# Patient Record
Sex: Female | Born: 1970
Health system: Southern US, Community
[De-identification: ages and names within clinical notes are randomized; demographics above are authoritative.]

## PROBLEM LIST (undated history)

## (undated) DIAGNOSIS — J45909 Unspecified asthma, uncomplicated: Secondary | ICD-10-CM

## (undated) DIAGNOSIS — M1990 Unspecified osteoarthritis, unspecified site: Secondary | ICD-10-CM

## (undated) DIAGNOSIS — Z6841 Body Mass Index (BMI) 40.0 and over, adult: Secondary | ICD-10-CM

## (undated) HISTORY — DX: Body Mass Index (BMI) 40.0 and over, adult: Z684

## (undated) HISTORY — DX: Unspecified osteoarthritis, unspecified site: M19.90

## (undated) HISTORY — DX: Morbid (severe) obesity due to excess calories: E66.01

## (undated) HISTORY — DX: Unspecified asthma, uncomplicated: J45.909

---

## 1996-03-16 HISTORY — PX: CHOLECYSTECTOMY: SHX55

## 2003-03-17 HISTORY — PX: TUBAL LIGATION: SHX77

## 2005-03-16 HISTORY — PX: RECTAL PROLAPSE REPAIR, RECTOPEXY: SHX2311

## 2011-05-19 DIAGNOSIS — R32 Unspecified urinary incontinence: Secondary | ICD-10-CM | POA: Diagnosis not present

## 2011-05-19 DIAGNOSIS — J45909 Unspecified asthma, uncomplicated: Secondary | ICD-10-CM | POA: Diagnosis not present

## 2011-06-05 DIAGNOSIS — M069 Rheumatoid arthritis, unspecified: Secondary | ICD-10-CM | POA: Diagnosis not present

## 2011-06-05 DIAGNOSIS — I10 Essential (primary) hypertension: Secondary | ICD-10-CM | POA: Diagnosis not present

## 2011-06-20 DIAGNOSIS — J45909 Unspecified asthma, uncomplicated: Secondary | ICD-10-CM | POA: Diagnosis not present

## 2011-06-30 DIAGNOSIS — J45909 Unspecified asthma, uncomplicated: Secondary | ICD-10-CM | POA: Diagnosis not present

## 2011-06-30 DIAGNOSIS — M940 Chondrocostal junction syndrome [Tietze]: Secondary | ICD-10-CM | POA: Diagnosis not present

## 2011-07-03 DIAGNOSIS — J45909 Unspecified asthma, uncomplicated: Secondary | ICD-10-CM | POA: Diagnosis not present

## 2011-08-21 DIAGNOSIS — M549 Dorsalgia, unspecified: Secondary | ICD-10-CM | POA: Diagnosis not present

## 2011-08-21 DIAGNOSIS — J45909 Unspecified asthma, uncomplicated: Secondary | ICD-10-CM | POA: Diagnosis not present

## 2011-09-03 DIAGNOSIS — E782 Mixed hyperlipidemia: Secondary | ICD-10-CM | POA: Diagnosis not present

## 2011-09-03 DIAGNOSIS — E039 Hypothyroidism, unspecified: Secondary | ICD-10-CM | POA: Diagnosis not present

## 2011-09-03 DIAGNOSIS — D649 Anemia, unspecified: Secondary | ICD-10-CM | POA: Diagnosis not present

## 2011-09-03 DIAGNOSIS — M129 Arthropathy, unspecified: Secondary | ICD-10-CM | POA: Diagnosis not present

## 2011-09-03 DIAGNOSIS — E559 Vitamin D deficiency, unspecified: Secondary | ICD-10-CM | POA: Diagnosis not present

## 2011-09-07 DIAGNOSIS — M898X9 Other specified disorders of bone, unspecified site: Secondary | ICD-10-CM | POA: Diagnosis not present

## 2011-09-07 DIAGNOSIS — M1710 Unilateral primary osteoarthritis, unspecified knee: Secondary | ICD-10-CM | POA: Diagnosis not present

## 2011-09-07 DIAGNOSIS — M1990 Unspecified osteoarthritis, unspecified site: Secondary | ICD-10-CM | POA: Diagnosis not present

## 2011-09-07 DIAGNOSIS — M19049 Primary osteoarthritis, unspecified hand: Secondary | ICD-10-CM | POA: Diagnosis not present

## 2011-09-07 DIAGNOSIS — M5137 Other intervertebral disc degeneration, lumbosacral region: Secondary | ICD-10-CM | POA: Diagnosis not present

## 2011-09-07 DIAGNOSIS — E559 Vitamin D deficiency, unspecified: Secondary | ICD-10-CM | POA: Diagnosis not present

## 2011-12-08 DIAGNOSIS — Z23 Encounter for immunization: Secondary | ICD-10-CM | POA: Diagnosis not present

## 2011-12-08 DIAGNOSIS — I10 Essential (primary) hypertension: Secondary | ICD-10-CM | POA: Diagnosis not present

## 2011-12-08 DIAGNOSIS — D649 Anemia, unspecified: Secondary | ICD-10-CM | POA: Diagnosis not present

## 2011-12-08 DIAGNOSIS — E782 Mixed hyperlipidemia: Secondary | ICD-10-CM | POA: Diagnosis not present

## 2011-12-08 DIAGNOSIS — E559 Vitamin D deficiency, unspecified: Secondary | ICD-10-CM | POA: Diagnosis not present

## 2011-12-08 DIAGNOSIS — E039 Hypothyroidism, unspecified: Secondary | ICD-10-CM | POA: Diagnosis not present

## 2011-12-08 DIAGNOSIS — M1990 Unspecified osteoarthritis, unspecified site: Secondary | ICD-10-CM | POA: Diagnosis not present

## 2012-01-15 DIAGNOSIS — I10 Essential (primary) hypertension: Secondary | ICD-10-CM | POA: Diagnosis not present

## 2012-01-15 DIAGNOSIS — J45909 Unspecified asthma, uncomplicated: Secondary | ICD-10-CM | POA: Diagnosis not present

## 2012-01-20 DIAGNOSIS — J45909 Unspecified asthma, uncomplicated: Secondary | ICD-10-CM | POA: Diagnosis not present

## 2012-01-20 DIAGNOSIS — I10 Essential (primary) hypertension: Secondary | ICD-10-CM | POA: Diagnosis not present

## 2012-02-03 DIAGNOSIS — I10 Essential (primary) hypertension: Secondary | ICD-10-CM | POA: Diagnosis not present

## 2012-02-03 DIAGNOSIS — J45909 Unspecified asthma, uncomplicated: Secondary | ICD-10-CM | POA: Diagnosis not present

## 2012-07-28 ENCOUNTER — Encounter: Payer: Self-pay | Admitting: Internal Medicine

## 2012-07-28 ENCOUNTER — Ambulatory Visit (INDEPENDENT_AMBULATORY_CARE_PROVIDER_SITE_OTHER): Payer: Medicare Other | Admitting: Internal Medicine

## 2012-07-28 VITALS — BP 135/89 | HR 50 | Temp 96.7°F | Ht 65.5 in | Wt 307.8 lb

## 2012-07-28 DIAGNOSIS — M129 Arthropathy, unspecified: Secondary | ICD-10-CM | POA: Diagnosis not present

## 2012-07-28 DIAGNOSIS — J45909 Unspecified asthma, uncomplicated: Secondary | ICD-10-CM

## 2012-07-28 DIAGNOSIS — Z0000 Encounter for general adult medical examination without abnormal findings: Secondary | ICD-10-CM | POA: Diagnosis not present

## 2012-07-28 DIAGNOSIS — M1990 Unspecified osteoarthritis, unspecified site: Secondary | ICD-10-CM

## 2012-07-28 DIAGNOSIS — N9089 Other specified noninflammatory disorders of vulva and perineum: Secondary | ICD-10-CM

## 2012-07-28 DIAGNOSIS — N907 Vulvar cyst: Secondary | ICD-10-CM

## 2012-07-28 MED ORDER — NAPROXEN 500 MG PO TABS: 500.0000 mg | ORAL_TABLET | Freq: Two times a day (BID) | ORAL | Status: DC | PRN

## 2012-07-28 MED ORDER — ALBUTEROL SULFATE HFA 108 (90 BASE) MCG/ACT IN AERS: 2.0000 | INHALATION_SPRAY | Freq: Four times a day (QID) | RESPIRATORY_TRACT | Status: DC | PRN

## 2012-07-28 MED ORDER — FLUTICASONE-SALMETEROL 250-50 MCG/DOSE IN AEPB: 1.0000 | INHALATION_SPRAY | Freq: Two times a day (BID) | RESPIRATORY_TRACT | Status: DC

## 2012-07-28 NOTE — Assessment & Plan Note (Signed)
Patient says she has a cyst on the left side inside her vulva which was drained once. She was advised to get it drained/removed if it increases in size. She wants a GYN referral for that. Referral placed.

## 2012-07-28 NOTE — Progress Notes (Signed)
  Subjective:    Patient ID: Cassidy [name], female    DOB: 04/11/1970, 42 y.o.   MRN: [id number]  HPI patient is a pleasant 42 year old woman with history of asthma, arthritis and vulvar cyst who comes to the clinic for establishment as a new patient.  She recently moved from [place] to [place] in January 2014 with her family.  She has asthma since she was in ninth grade and is stable on albuterol inhaler as needed and Advair twice a day. She needs to use albuterol once or twice a month more so when she is walking or exercising.  She was diagnosed with arthritis- unclear if rheumatoid or osteoarthritis by her primary care physician in [place], and was advised to take NSAIDs as needed and to have weight loss and exercise.  She says she has gained about 25 pounds last 4-5 months.  She brings in the copy of last lab results from September 2013 from her primary care physician- which are all essentially within normal limits. CBC, CMP, lipid panel, TSH, T4. Hemoglobin 11.3 was only mildly abnormal result.  She reports worsening of her joint pains. She has pain in her right knee, ankle, wrist, elbow. Also has some pain in her left knee but most of her symptoms are predominantly on right side. She also has low back pain. The pain gets worse with movements. She does report morning stiffness of her joints were right side for about 30-40 minutes. Reports swelling of her knee and ankle joints for past 3-4 weeks.  There is no radiation of the pain. It stays in the joints mostly. No fever, chills, nausea, vomiting, abdominal pain, chest pain, short of breath, headache, palpitations. She is able to ambulate properly although with pain.  - Patient says she has been up-to-date with her immunizations.   Review of Systems     As per history of present illness.  Objective:   Physical Exam  General: NAD HEENT: PERRL, EOMI, no scleral icterus Cardiac: S1, S2, RRR, no rubs, murmurs or gallops Pulm: clear  to auscultation bilaterally, moving normal volumes of air Abd: soft, nontender, nondistended, BS present Ext: warm and well perfused, no pedal edema. Musculoskeletal: Tenderness to palpation of right knee and ankle. No significant swelling or redness noted. No increased warmth. Full range of motion of right knee but causes pain. Left knee normal range of motion without pain.  Neuro: alert and oriented X3, cranial nerves II-XII grossly intact       Assessment & Plan:

## 2012-07-28 NOTE — Assessment & Plan Note (Signed)
Patient has a family history of colon cancer in brother diagnosed in late 5s. No other family history of colon cancer, breast cancer, uterine cancer. - Patient will probably need early colonoscopy. - Discussed that with patient today, but referral not placed. We'll get the records from primary care physician. - Re- Discussion during next clinic visit and possible referral for colonoscopy.

## 2012-07-28 NOTE — Assessment & Plan Note (Signed)
Chronic stable asthma. Patient on Advair 250/50 twice daily. Albuterol as needed. Which she needs once or twice a month especially when she is walking or running. She also says she uses albuterol nebulizer as needed but does not want refill today.  - Refill for albuterol and Advair done.

## 2012-07-28 NOTE — Assessment & Plan Note (Signed)
Unclear if rheumatoid arthritis or osteoarthritis. Clinical picture sounds more persistent with osteoarthritis especially as patient is morbidly obese.  - Will get records from her primary care physician in [place]. - Meanwhile continue naproxen 500 mg twice a day as needed. - Advised to use more of Tylenol as needed and less of naproxen.  - Will not do any imaging today or any lab tests until we get the records.

## 2012-07-28 NOTE — Patient Instructions (Addendum)
Please make appointment in 3-4 months or earlier if needed.  If your joint pains does not get better or starts getting worse, give [place] a call to make an early appointment.  Keep using the inhalers as you do.   For arthritis- take Tylenol- up to total of 4 g per day as needed in divided doses.  Also take naproxen 500 mg 2 times a day as needed.  We will try to get records from Dr. Malva Cogan.

## 2012-07-29 NOTE — Progress Notes (Signed)
INTERNAL MEDICINE TEACHING ATTENDING ADDENDUM: I discussed this case with Dr. Patel soon after the patient visit. I have read the documentation and I agree with the plan of care. Please see the resident note for details of management.  

## 2012-08-02 ENCOUNTER — Telehealth: Payer: Self-pay | Admitting: *Deleted

## 2012-08-02 DIAGNOSIS — J45909 Unspecified asthma, uncomplicated: Secondary | ICD-10-CM

## 2012-08-02 NOTE — Telephone Encounter (Signed)
Received faxed documentation from pt's pharmacy that insurance will not pay for the advair diskus 250/50 .  Preferred medication is symbicort.  Attempted to contact patient for additional information, number on chart unable to accept calls at this time.  Pt was seen once here in [facility]  and does not have any future appts scheduled.  Pt ID # [id number] prior authorization phone # [phone].  Will forward to prescribing MD for review.  Please advise.Criss Alvine, Ineta Sinning Cassady5/20/20149:12 AM

## 2012-08-05 ENCOUNTER — Encounter: Payer: Self-pay | Admitting: Internal Medicine

## 2012-08-05 DIAGNOSIS — E559 Vitamin D deficiency, unspecified: Secondary | ICD-10-CM | POA: Insufficient documentation

## 2012-08-09 ENCOUNTER — Ambulatory Visit: Payer: Self-pay | Admitting: Internal Medicine

## 2012-08-17 ENCOUNTER — Encounter: Payer: Self-pay | Admitting: Internal Medicine

## 2012-08-31 MED ORDER — BUDESONIDE-FORMOTEROL FUMARATE 80-4.5 MCG/ACT IN AERO: 2.0000 | INHALATION_SPRAY | Freq: Two times a day (BID) | RESPIRATORY_TRACT | Status: DC

## 2012-08-31 NOTE — Telephone Encounter (Signed)
Symbicort prescribed as it is preferred by Medicare.  Advair D/C'd.

## 2012-09-08 ENCOUNTER — Ambulatory Visit: Payer: Medicare Other | Admitting: Internal Medicine

## 2012-09-27 ENCOUNTER — Encounter: Payer: Self-pay | Admitting: Obstetrics & Gynecology

## 2012-09-28 ENCOUNTER — Ambulatory Visit: Payer: Medicare Other | Admitting: Internal Medicine

## 2012-09-28 DIAGNOSIS — Z0289 Encounter for other administrative examinations: Secondary | ICD-10-CM

## 2012-10-24 ENCOUNTER — Encounter: Payer: Medicare Other | Admitting: Obstetrics & Gynecology

## 2012-10-31 NOTE — Addendum Note (Signed)
Addended by: Neomia Dear on: 10/31/2012 06:48 PM   Modules accepted: Orders

## 2012-12-16 ENCOUNTER — Ambulatory Visit: Payer: Medicare Other | Admitting: Internal Medicine

## 2012-12-16 ENCOUNTER — Encounter: Payer: Self-pay | Admitting: Internal Medicine

## 2013-01-24 ENCOUNTER — Ambulatory Visit: Payer: Medicare Other | Admitting: Internal Medicine

## 2013-01-25 ENCOUNTER — Ambulatory Visit (HOSPITAL_COMMUNITY)
Admission: RE | Admit: 2013-01-25 | Discharge: 2013-01-25 | Disposition: A | Discharge status: Home / Self Care | Payer: Medicare Other | Source: Ambulatory Visit | Attending: Internal Medicine | Admitting: Internal Medicine

## 2013-01-25 ENCOUNTER — Other Ambulatory Visit: Payer: Self-pay | Admitting: Internal Medicine

## 2013-01-25 ENCOUNTER — Ambulatory Visit (INDEPENDENT_AMBULATORY_CARE_PROVIDER_SITE_OTHER): Payer: Medicare Other | Admitting: Internal Medicine

## 2013-01-25 DIAGNOSIS — M1990 Unspecified osteoarthritis, unspecified site: Secondary | ICD-10-CM

## 2013-01-25 DIAGNOSIS — R52 Pain, unspecified: Secondary | ICD-10-CM

## 2013-01-25 DIAGNOSIS — Z800 Family history of malignant neoplasm of digestive organs: Secondary | ICD-10-CM

## 2013-01-25 DIAGNOSIS — R9431 Abnormal electrocardiogram [ECG] [EKG]: Secondary | ICD-10-CM | POA: Insufficient documentation

## 2013-01-25 DIAGNOSIS — E559 Vitamin D deficiency, unspecified: Secondary | ICD-10-CM | POA: Diagnosis not present

## 2013-01-25 DIAGNOSIS — N907 Vulvar cyst: Secondary | ICD-10-CM

## 2013-01-25 DIAGNOSIS — Z0000 Encounter for general adult medical examination without abnormal findings: Secondary | ICD-10-CM

## 2013-01-25 DIAGNOSIS — J310 Chronic rhinitis: Secondary | ICD-10-CM | POA: Insufficient documentation

## 2013-01-25 DIAGNOSIS — R0609 Other forms of dyspnea: Secondary | ICD-10-CM | POA: Insufficient documentation

## 2013-01-25 DIAGNOSIS — M25569 Pain in unspecified knee: Secondary | ICD-10-CM | POA: Insufficient documentation

## 2013-01-25 DIAGNOSIS — M25561 Pain in right knee: Secondary | ICD-10-CM | POA: Insufficient documentation

## 2013-01-25 DIAGNOSIS — N9089 Other specified noninflammatory disorders of vulva and perineum: Secondary | ICD-10-CM

## 2013-01-25 DIAGNOSIS — R059 Cough, unspecified: Secondary | ICD-10-CM | POA: Diagnosis not present

## 2013-01-25 DIAGNOSIS — M25579 Pain in unspecified ankle and joints of unspecified foot: Secondary | ICD-10-CM | POA: Insufficient documentation

## 2013-01-25 DIAGNOSIS — J45909 Unspecified asthma, uncomplicated: Secondary | ICD-10-CM

## 2013-01-25 DIAGNOSIS — M129 Arthropathy, unspecified: Secondary | ICD-10-CM | POA: Diagnosis not present

## 2013-01-25 DIAGNOSIS — R05 Cough: Secondary | ICD-10-CM

## 2013-01-25 DIAGNOSIS — R0989 Other specified symptoms and signs involving the circulatory and respiratory systems: Secondary | ICD-10-CM | POA: Diagnosis not present

## 2013-01-25 LAB — RHEUMATOID FACTOR: Rhuematoid fact SerPl-aCnc: <10 IU/mL (ref <=14)

## 2013-01-25 MED ORDER — FLUTICASONE-SALMETEROL 250-50 MCG/DOSE IN AEPB: 1.0000 | INHALATION_SPRAY | Freq: Two times a day (BID) | RESPIRATORY_TRACT | Status: DC

## 2013-01-25 MED ORDER — OMEPRAZOLE 20 MG PO TBEC: 20.0000 mg | DELAYED_RELEASE_TABLET | Freq: Every day | ORAL | Status: DC

## 2013-01-25 MED ORDER — FLUTICASONE PROPIONATE 50 MCG/ACT NA SUSP: 1.0000 | Freq: Every day | NASAL | Status: DC

## 2013-01-25 NOTE — Assessment & Plan Note (Signed)
Needs further evaluation Involving bilateral knees, left ankle, bilateral hands -will check ANA, RF, ACPA, ESR, and CRP -XR bilateral knees for osteoarthritis given reports of falls, and "knee giving out"

## 2013-01-25 NOTE — Progress Notes (Signed)
Subjective:    Patient ID: Cassidy [name], female    DOB: 04/01/1970, 42 y.o.   MRN: [id number]  HPI  Relatively new pt to the [facility]. Seen for the first time in April 2014. Presents for f/u of asthma and arthritis.  She reports a multitude of complaints today. States she has been getting more short of breath when walking up her stairs. States that right arm, wrist, left ankle and knee keep swelling. States that she had a rash on her lower abdomen that was associated with numbness that has since resolved. Uses ~ 2-3 pills per day of Naprosyn. Uses 2-3 pills of Tylenol as needed. States that she gets short of breath walking up her stairs whereby she has to stop and rest. Her left knee has been "giving out on me" and she has fallen twice over past 6 months. It is very difficult for her to raise up out of the tub and she must keep her knees slightly straightened when seated to prevent discomfort.   States that she has a non-productive cough but no wheezes.  Uses the Advair inhaler daily and albuterol ~ 2x a day.   Pt was scheduled for GI (for early colonoscopy) and GYN (h/o vulvar cyst and PAP) consults in [facility] which were no-show. Pt states that she has no memory of being scheduled. Hx is significanct for brother with colon cancer in his late 36s who died and another brother who is 76 yo with cancer of unknown origin.   Also reports stomach rash months ago that resolved with antibiotic ointment.  States that it was associated with numbness.    Reports that she had an EKG in [place] that showed an abnormality that she thinks was "WPW".  Review of Systems  Constitutional: Negative for fever and fatigue.  HENT: Negative for congestion.   Respiratory: Positive for cough, shortness of breath and wheezing. Negative for chest tightness.   Cardiovascular: Positive for leg swelling. Negative for chest pain and palpitations.  Gastrointestinal: Negative for nausea, vomiting, diarrhea and constipation.   Musculoskeletal: Positive for arthralgias, back pain and joint swelling. Negative for myalgias.  Skin: Positive for rash.       resolved  Neurological: Positive for numbness. Negative for weakness.       Objective:   Physical Exam  Constitutional: She is oriented to person, place, and time. She appears well-developed and well-nourished. No distress.  obese  HENT:  Head: Normocephalic and atraumatic.  Eyes: Conjunctivae and EOM are normal. Pupils are equal, round, and reactive to light.  Neck: Normal range of motion. Neck supple.  Cardiovascular: Normal rate, regular rhythm, normal heart sounds and intact distal pulses.   No murmur heard. Pulmonary/Chest: Effort normal and breath sounds normal. No respiratory distress. She has no wheezes. She has no rales.  Abdominal: Soft. Bowel sounds are normal. There is no tenderness.  obese  Musculoskeletal: She exhibits tenderness.       Right knee: Tenderness found.       Left knee: Tenderness found.       Left ankle: She exhibits swelling. Tenderness.       Left lower leg: She exhibits tenderness. She exhibits no swelling.  Neurological: She is alert and oriented to person, place, and time. No cranial nerve deficit.  Skin: Skin is warm and dry. No rash noted. No erythema.  Psychiatric: She has a normal mood and affect.          Assessment & Plan:  See separate problem list  charting:  # 1 arthritis: worsening per pt report, unclear osteo vs rheumatoid -XR bilateral knees today, and left ankle--> mild degenerative dx, no erosions -check ANA, RF, ESR, CCP -cont Tylenol and Naprosyn -will RX PPI   #2 family h/o colon cancer: needs colonoscopy -reschedule GI appt  #3 asthma: stable on inhalers  #4 dyspnea on exertion: minimal LE edema, no crackles on lung exam or other signs of volume overload today, may be component of poor conditioning -check EKG--> normal -check BNP--> unable to get specimen today, check on fu -consider  ECHO  #5 cough: likely secondary to post-nasal drip -start loratadine -start Flonase  # 6 h/o vulvar cyst: re-schedule GYN appt  #7 Preventative Care: declines flu shot, will refer for colonoscopy

## 2013-01-25 NOTE — Patient Instructions (Signed)
Today we will check Xrays and blood work to further assess your arthritis.  We will contact you with the results. Your EKG was normal. We have called in medicine to your pharmacy. A nasal spray, an allergy pill, a pill to protect your stomach from acid, and refilled your inhalers. We will reschedule your gynecology and colon doctor appointment. Please keep these appointment or it will be difficult to have them rescheduled. Schedule follow-up with your Primary Physician after you have seen the GYN and Colon doctor.

## 2013-01-26 LAB — ANA: Anti Nuclear Antibody(ANA): NEGATIVE

## 2013-01-27 LAB — CBC
HCT: 37.2 % (ref 36.0–46.0)
Hemoglobin: 11.8 g/dL — ABNORMAL LOW (ref 12.0–15.0)
MCH: 26.3 pg (ref 26.0–34.0)
MCHC: 31.7 g/dL (ref 30.0–36.0)
MCV: 83 fL (ref 78.0–100.0)
Platelets: 396 10*3/uL (ref 150–400)
RBC: 4.48 MIL/uL (ref 3.87–5.11)

## 2013-01-29 LAB — VITAMIN D 1,25 DIHYDROXY
Vitamin D 1, 25 (OH)2 Total: 83 pg/mL — ABNORMAL HIGH (ref 18–72)
Vitamin D3 1, 25 (OH)2: 54 pg/mL

## 2013-01-31 NOTE — Addendum Note (Signed)
Addended by: Debe Coder B on: 01/31/2013 01:48 PM   Modules accepted: Level of Service

## 2013-01-31 NOTE — Progress Notes (Signed)
Case discussed with Dr. Schooler soon after the resident saw the patient.  We reviewed the resident's history and exam and pertinent patient test results.  I agree with the assessment, diagnosis, and plan of care documented in the resident's note. 

## 2013-02-06 ENCOUNTER — Encounter: Payer: Self-pay | Admitting: Obstetrics & Gynecology

## 2013-03-22 ENCOUNTER — Encounter: Payer: Medicare Other | Admitting: Obstetrics & Gynecology

## 2013-03-27 NOTE — Addendum Note (Signed)
Addended by: Neomia DearPOWERS, Syerra Abdelrahman E on: 03/27/2013 06:59 PM   Modules accepted: Orders

## 2013-03-29 NOTE — Addendum Note (Signed)
Addended by: Neomia DearPOWERS, Denis Carreon E on: 03/29/2013 06:49 PM   Modules accepted: Orders

## 2013-05-05 ENCOUNTER — Ambulatory Visit: Payer: Medicare Other | Admitting: Internal Medicine

## 2013-05-05 ENCOUNTER — Encounter: Payer: Self-pay | Admitting: Internal Medicine

## 2013-05-15 ENCOUNTER — Encounter: Payer: Self-pay | Admitting: Dietician

## 2013-05-15 ENCOUNTER — Encounter: Payer: Self-pay | Admitting: Internal Medicine

## 2013-05-15 ENCOUNTER — Ambulatory Visit (INDEPENDENT_AMBULATORY_CARE_PROVIDER_SITE_OTHER): Payer: Medicare Other | Admitting: Internal Medicine

## 2013-05-15 ENCOUNTER — Ambulatory Visit (INDEPENDENT_AMBULATORY_CARE_PROVIDER_SITE_OTHER): Payer: Medicare Other | Admitting: Dietician

## 2013-05-15 VITALS — BP 131/82 | HR 62 | Temp 98.3°F | Ht 67.0 in | Wt 320.0 lb

## 2013-05-15 DIAGNOSIS — N9089 Other specified noninflammatory disorders of vulva and perineum: Secondary | ICD-10-CM

## 2013-05-15 DIAGNOSIS — E559 Vitamin D deficiency, unspecified: Secondary | ICD-10-CM

## 2013-05-15 DIAGNOSIS — J45909 Unspecified asthma, uncomplicated: Secondary | ICD-10-CM | POA: Diagnosis not present

## 2013-05-15 DIAGNOSIS — M25562 Pain in left knee: Secondary | ICD-10-CM

## 2013-05-15 DIAGNOSIS — Z0000 Encounter for general adult medical examination without abnormal findings: Secondary | ICD-10-CM

## 2013-05-15 DIAGNOSIS — Z6841 Body Mass Index (BMI) 40.0 and over, adult: Principal | ICD-10-CM

## 2013-05-15 DIAGNOSIS — M129 Arthropathy, unspecified: Secondary | ICD-10-CM

## 2013-05-15 DIAGNOSIS — R0609 Other forms of dyspnea: Secondary | ICD-10-CM

## 2013-05-15 DIAGNOSIS — M19049 Primary osteoarthritis, unspecified hand: Secondary | ICD-10-CM

## 2013-05-15 DIAGNOSIS — M25569 Pain in unspecified knee: Secondary | ICD-10-CM

## 2013-05-15 DIAGNOSIS — N907 Vulvar cyst: Secondary | ICD-10-CM

## 2013-05-15 DIAGNOSIS — M1990 Unspecified osteoarthritis, unspecified site: Secondary | ICD-10-CM

## 2013-05-15 DIAGNOSIS — M25561 Pain in right knee: Secondary | ICD-10-CM

## 2013-05-15 DIAGNOSIS — R0989 Other specified symptoms and signs involving the circulatory and respiratory systems: Secondary | ICD-10-CM

## 2013-05-15 HISTORY — DX: Morbid (severe) obesity due to excess calories: E66.01

## 2013-05-15 MED ORDER — OMEPRAZOLE 20 MG PO TBEC: 20.0000 mg | DELAYED_RELEASE_TABLET | Freq: Every day | ORAL | Status: DC

## 2013-05-15 MED ORDER — NAPROXEN 500 MG PO TABS: 500.0000 mg | ORAL_TABLET | Freq: Two times a day (BID) | ORAL | Status: DC | PRN

## 2013-05-15 NOTE — Assessment & Plan Note (Signed)
Improved, states on gets "winded" when going upstairs.
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
## 2013-05-15 NOTE — Progress Notes (Signed)
Case discussed with Dr. Schooler at the time of the visit.  We reviewed the resident's history and exam and pertinent patient test results.  I agree with the assessment, diagnosis, and plan of care documented in the resident's note.     

## 2013-05-15 NOTE — Assessment & Plan Note (Addendum)
Adequate Vit D level after supplemetation

## 2013-05-15 NOTE — Patient Instructions (Signed)
Your goal for the next 2 weeks:  1- pick up diet soda or a diet drink with zero calories today and begin drinking that instead of regular full calorie juice 2- Limit fried food to one time a day Your follow up to monitor if this plan it working is 05-29-13 at 10:30 AM. Cassidy [name] [phone]

## 2013-05-15 NOTE — Patient Instructions (Addendum)
Fill your prescription for the Naprosyn and take as instructed along with the omeprazole which will protect your stomach. We have also refilled your Advair. Keep your appt with ob-gyn. We you have checked your schedule, call the clinic so that we can try to reschedule you with a gut doctor for your colonoscopy. It will be very important to keep this appointment.

## 2013-05-15 NOTE — Assessment & Plan Note (Addendum)
Stable without exacerbation Pt reports having enough refills on inhalers except Advair. Refilled today.

## 2013-05-15 NOTE — Assessment & Plan Note (Signed)
States that she continues to wake up with bilateral hand stiffness which resolves in less than 30 minutes.  Has not been taking Naprosyn because either lost prescription or "ran out".  Feels like left knee gives out at time but hasn't fallen. Also reports bilateral inner knee bruising. Prior lab work thus far negative including ANA, RF, ESR with exception of elevated CRP. Counseled on weight lost and proper diet.  Advised aqua aerobics. And recommended Nutriotion consult with Barry Brunner. After trial of scheduled Naprosyn will consider referral to Rheumatology if no improvement.

## 2013-05-15 NOTE — Assessment & Plan Note (Addendum)
Didn't f/u with Dr. Marice Potterove of GYN.  Has resolved for now but pt states that it comes and goes. Pt will call on her on accord to reschedule (07/04/13). Will also need Pap Smear.  Declines PAP today.

## 2013-05-15 NOTE — Progress Notes (Signed)
   Subjective:    Patient ID: Cassidy [name], female    DOB: [birth year], 43 y.o.   MRN: [id number]  HPI  Ms. Mcburney presents for f/u of arthritis and health maintenance. She was last seen clinic Nov 2015 with  lab tests at that time for rheum arthritis and connective tissue d/o with RF, ANA, ESR within normal limits.   CRP elevated at 1.2 .  Pt did not get Naprosyn prescription filled at last visit.  It appears that she didn't realized that it was given to her. She no-showed for her colonoscopy appt with Dr. Benson Norway and has not followed up with GYN for vulvar cyst. Hx significant for brother who died of colon cancer at age 64 yo.  Takes less than 30 min to loosen hand stiffness in the hands bilaterally.  Starts getting bilateral knee pain as the day progresses as well as bilateral hand swelling. Weight 320 lbs.  Loves to eat fried food and does not exercise.   Review of Systems  Constitutional: Negative for fever and fatigue.  HENT: Negative for rhinorrhea.   Eyes: Negative.   Respiratory: Positive for shortness of breath. Negative for chest tightness.        DOE when climbing stairs  Cardiovascular: Negative for palpitations and leg swelling.  Gastrointestinal: Positive for constipation. Negative for nausea, vomiting, abdominal pain, diarrhea, blood in stool, anal bleeding and rectal pain.  Endocrine: Negative.   Genitourinary: Negative for dysuria and hematuria.       Urine bright green since starting taking a otc supplement "Tumeric"  Skin:       Small pustule on right inner breast which has "popped" and resolving  Neurological: Negative for light-headedness and headaches.  Hematological:       Inner knee bruising  Psychiatric/Behavioral: Negative.        Objective:   Physical Exam  Constitutional: She is oriented to person, place, and time. She appears well-developed and well-nourished. No distress.  Morbidly obese  HENT:  Head: Normocephalic and atraumatic.  Eyes:  Conjunctivae and EOM are normal. Pupils are equal, round, and reactive to light.  Neck: Normal range of motion. Neck supple. No thyromegaly present.  Cardiovascular: Normal rate, regular rhythm, normal heart sounds and intact distal pulses.   Pulmonary/Chest: Effort normal and breath sounds normal.  Abdominal: Soft. Bowel sounds are normal. There is no tenderness.  obese  Genitourinary:  declines  Musculoskeletal: She exhibits no edema and no tenderness.  Neurological: She is alert and oriented to person, place, and time.  Skin: Skin is warm and dry.  Small healing excoriation vs pustule Hyperpigmented medial aspect of bilateral breasts  Psychiatric: She has a normal mood and affect.          Assessment & Plan:  See separate problem-list charting:

## 2013-05-15 NOTE — Assessment & Plan Note (Addendum)
Discussed need for screening colonoscopy given family hx of colon cancer in brother at age 10537.  Pt no-showed with [facility] and did not schedule new pt appt with [place] [facility].  Pt agrees to contact clinic when she has reviewed her scheduled and can definitely keep GI appt.  Declines Pap today.  Scheduled for GYN 4/21 2:45 for PAP and f/u vulvar cyst. Declines tdap and flu vaccine.

## 2013-05-15 NOTE — Assessment & Plan Note (Signed)
Likely worsened by obesity. Recommended aqua aerobics, resume naprosyn, and will see Nutritionist today.

## 2013-05-17 NOTE — Progress Notes (Signed)
Case discussed with Dr. Schooler at the time of the visit.  We reviewed the resident's history and exam and pertinent patient test results.  I agree with the assessment, diagnosis, and plan of care documented in the resident's note.     

## 2013-05-29 ENCOUNTER — Ambulatory Visit: Payer: Medicare Other | Admitting: Dietician

## 2013-06-15 ENCOUNTER — Encounter: Payer: Self-pay | Admitting: Obstetrics & Gynecology

## 2013-06-15 ENCOUNTER — Other Ambulatory Visit (HOSPITAL_COMMUNITY)
Admission: RE | Admit: 2013-06-15 | Discharge: 2013-06-15 | Disposition: A | Discharge status: Home / Self Care | Payer: Medicare Other | Source: Ambulatory Visit | Attending: Obstetrics & Gynecology | Admitting: Obstetrics & Gynecology

## 2013-06-15 ENCOUNTER — Ambulatory Visit (INDEPENDENT_AMBULATORY_CARE_PROVIDER_SITE_OTHER): Payer: Medicare Other | Admitting: Obstetrics & Gynecology

## 2013-06-15 VITALS — BP 124/86 | HR 66 | Ht 67.0 in | Wt 309.2 lb

## 2013-06-15 DIAGNOSIS — Z1389 Encounter for screening for other disorder: Secondary | ICD-10-CM

## 2013-06-15 DIAGNOSIS — N9089 Other specified noninflammatory disorders of vulva and perineum: Secondary | ICD-10-CM | POA: Diagnosis not present

## 2013-06-15 DIAGNOSIS — Z124 Encounter for screening for malignant neoplasm of cervix: Secondary | ICD-10-CM | POA: Insufficient documentation

## 2013-06-15 DIAGNOSIS — Z0000 Encounter for general adult medical examination without abnormal findings: Secondary | ICD-10-CM

## 2013-06-15 DIAGNOSIS — Z1151 Encounter for screening for human papillomavirus (HPV): Secondary | ICD-10-CM | POA: Insufficient documentation

## 2013-06-15 DIAGNOSIS — N907 Vulvar cyst: Secondary | ICD-10-CM

## 2013-06-15 DIAGNOSIS — Z1231 Encounter for screening mammogram for malignant neoplasm of breast: Secondary | ICD-10-CM | POA: Diagnosis not present

## 2013-06-15 DIAGNOSIS — Z13811 Encounter for screening for lower gastrointestinal disorder: Secondary | ICD-10-CM

## 2013-06-15 NOTE — Progress Notes (Signed)
Her OBGYN in [place] drained her Bartholins Cyst several times and had offered her a surgical procedure to remove it but she never had it done. Currently it is not bothering her but would like to discuss treatment options.

## 2013-06-15 NOTE — Progress Notes (Signed)
HPI:  Pt presents for a well woman gynecological visit. She is from [place] and had her last OB/GYN visit there about two years ago. She had a pap at that time, which was reportedly normal. She has no history of abnormal pap smears. Has not yet gotten a mammogram.  Her periods have been irregular for "all of her life". She sometimes has them twice in a month, and then will skip a month. They typically last about 5 days. She never goes for several months without a period. She had a BTL 10 years ago. Has had one female partner in the last 15 years.  She has a hx of a bartholin's cyst that has been drained several times in the past up in [place]. At one point, they considered a surgery to remove it. It is not causing her a problem today but sometimes itches and burns, especially during intercourse. She denies having any vaginal discharge or pelvic pain.  ROS: See HPI  PMFSH: hx asthma, vit D deficiency, obesity  PHYSICAL EXAM: BP 124/86  Pulse 66  Ht 5\' 7"  (1.702 m)  Wt 309 lb 3.2 oz (140.252 kg)  BMI 48.42 kg/m2  LMP 05/08/2013 Gen: NAD, pleasant, cooperative HEENT: NCAT Breasts: breasts appear normal, no suspicious masses, no skin or nipple changes or axillary nodes Heart: RRR, no murmurs Lungs: CTAB, NWOB Abdomen: soft, nontender to palpation GU: normal appearing external genitalia. Vagina is moist with thin white discharge. Cervix normal in appearance. No cervical motion tenderness or tenderness on bimanual exam. No adnexal masses. There is an approximately 3cm soft, fluid filled cyst on the inferior left introitus, without induration, erythema, or warmth. Neuro: grossly nonfocal, speech intact  ASSESSMENT/PLAN:  # Health maintenance:  -pap smear obtained today -schedule mammogram -handout given on health maintenance behaviors  FOLLOW UP: F/u in one year for routine gynecological care.  GrenadaBrittany J. Pollie MeyerMcIntyre, MD Family Medicine Resident PGY-2

## 2013-06-15 NOTE — Patient Instructions (Addendum)
Return if your periods get worse or more irregular. Return if your cyst gets more painful or becomes more of a problem.  Health Maintenance, Female A healthy lifestyle and preventative care can promote health and wellness.  Maintain regular health, dental, and eye exams.  Eat a healthy diet. Foods like vegetables, fruits, whole grains, low-fat dairy products, and lean protein foods contain the nutrients you need without too many calories. Decrease your intake of foods high in solid fats, added sugars, and salt. Get information about a proper diet from your caregiver, if necessary.  Regular physical exercise is one of the most important things you can do for your health. Most adults should get at least 150 minutes of moderate-intensity exercise (any activity that increases your heart rate and causes you to sweat) each week. In addition, most adults need muscle-strengthening exercises on 2 or more days a week.   Maintain a healthy weight. The body mass index (BMI) is a screening tool to identify possible weight problems. It provides an estimate of body fat based on height and weight. Your caregiver can help determine your BMI, and can help you achieve or maintain a healthy weight. For adults 20 years and older:  A BMI below 18.5 is considered underweight.  A BMI of 18.5 to 24.9 is normal.  A BMI of 25 to 29.9 is considered overweight.  A BMI of 30 and above is considered obese.  Maintain normal blood lipids and cholesterol by exercising and minimizing your intake of saturated fat. Eat a balanced diet with plenty of fruits and vegetables. Blood tests for lipids and cholesterol should begin at age 19 and be repeated every 5 years. If your lipid or cholesterol levels are high, you are over 50, or you are a high risk for heart disease, you may need your cholesterol levels checked more frequently.Ongoing high lipid and cholesterol levels should be treated with medicines if diet and exercise are not  effective.  If you smoke, find out from your caregiver how to quit. If you do not use tobacco, do not start.  Lung cancer screening is recommended for adults aged 54 80 years who are at high risk for developing lung cancer because of a history of smoking. Yearly low-dose computed tomography (CT) is recommended for people who have at least a 30-pack-year history of smoking and are a current smoker or have quit within the past 15 years. A pack year of smoking is smoking an average of 1 pack of cigarettes a day for 1 year (for example: 1 pack a day for 30 years or 2 packs a day for 15 years). Yearly screening should continue until the smoker has stopped smoking for at least 15 years. Yearly screening should also be stopped for people who develop a health problem that would prevent them from having lung cancer treatment.  If you are pregnant, do not drink alcohol. If you are breastfeeding, be very cautious about drinking alcohol. If you are not pregnant and choose to drink alcohol, do not exceed 1 drink per day. One drink is considered to be 12 ounces (355 mL) of beer, 5 ounces (148 mL) of wine, or 1.5 ounces (44 mL) of liquor.  Avoid use of street drugs. Do not share needles with anyone. Ask for help if you need support or instructions about stopping the use of drugs.  High blood pressure causes heart disease and increases the risk of stroke. Blood pressure should be checked at least every 1 to 2 years.  Ongoing high blood pressure should be treated with medicines, if weight loss and exercise are not effective.  If you are 42 to 41 years old, ask your caregiver if you should take aspirin to prevent strokes.  Diabetes screening involves taking a blood sample to check your fasting blood sugar level. This should be done once every 3 years, after age 42, if you are within normal weight and without risk factors for diabetes. Testing should be considered at a younger age or be carried out more frequently if you  are overweight and have at least 1 risk factor for diabetes.  Breast cancer screening is essential preventative care for women. You should practice "breast self-awareness." This means understanding the normal appearance and feel of your breasts and may include breast self-examination. Any changes detected, no matter how small, should be reported to a caregiver. Women in their 54s and 30s should have a clinical breast exam (CBE) by a caregiver as part of a regular health exam every 1 to 3 years. After age 39, women should have a CBE every year. Starting at age 20, women should consider having a mammogram (breast X-ray) every year. Women who have a family history of breast cancer should talk to their caregiver about genetic screening. Women at a high risk of breast cancer should talk to their caregiver about having an MRI and a mammogram every year.  Breast cancer gene (BRCA)-related cancer risk assessment is recommended for women who have family members with BRCA-related cancers. BRCA-related cancers include breast, ovarian, tubal, and peritoneal cancers. Having family members with these cancers may be associated with an increased risk for harmful changes (mutations) in the breast cancer genes BRCA1 and BRCA2. Results of the assessment will determine the need for genetic counseling and BRCA1 and BRCA2 testing.  The Pap test is a screening test for cervical cancer. Women should have a Pap test starting at age 25. Between ages 57 and 33, Pap tests should be repeated every 2 years. Beginning at age 55, you should have a Pap test every 3 years as long as the past 3 Pap tests have been normal. If you had a hysterectomy for a problem that was not cancer or a condition that could lead to cancer, then you no longer need Pap tests. If you are between ages 31 and 72, and you have had normal Pap tests going back 10 years, you no longer need Pap tests. If you have had past treatment for cervical cancer or a condition that  could lead to cancer, you need Pap tests and screening for cancer for at least 20 years after your treatment. If Pap tests have been discontinued, risk factors (such as a new sexual partner) need to be reassessed to determine if screening should be resumed. Some women have medical problems that increase the chance of getting cervical cancer. In these cases, your caregiver may recommend more frequent screening and Pap tests.  The human papillomavirus (HPV) test is an additional test that may be used for cervical cancer screening. The HPV test looks for the virus that can cause the cell changes on the cervix. The cells collected during the Pap test can be tested for HPV. The HPV test could be used to screen women aged 40 years and older, and should be used in women of any age who have unclear Pap test results. After the age of 36, women should have HPV testing at the same frequency as a Pap test.  Colorectal cancer can be  detected and often prevented. Most routine colorectal cancer screening begins at the age of 28 and continues through age 45. However, your caregiver may recommend screening at an earlier age if you have risk factors for colon cancer. On a yearly basis, your caregiver may provide home test kits to check for hidden blood in the stool. Use of a small camera at the end of a tube, to directly examine the colon (sigmoidoscopy or colonoscopy), can detect the earliest forms of colorectal cancer. Talk to your caregiver about this at age 4, when routine screening begins. Direct examination of the colon should be repeated every 5 to 10 years through age 72, unless early forms of pre-cancerous polyps or small growths are found.  Hepatitis C blood testing is recommended for all people born from [birth year] through 1963 and any individual with known risks for hepatitis C.  Practice safe sex. Use condoms and avoid high-risk sexual practices to reduce the spread of sexually transmitted infections (STIs). Sexually  active women aged 37 and younger should be checked for Chlamydia, which is a common sexually transmitted infection. Older women with new or multiple partners should also be tested for Chlamydia. Testing for other STIs is recommended if you are sexually active and at increased risk.  Osteoporosis is a disease in which the bones lose minerals and strength with aging. This can result in serious bone fractures. The risk of osteoporosis can be identified using a bone density scan. Women ages 22 and over and women at risk for fractures or osteoporosis should discuss screening with their caregivers. Ask your caregiver whether you should be taking a calcium supplement or vitamin D to reduce the rate of osteoporosis.  Menopause can be associated with physical symptoms and risks. Hormone replacement therapy is available to decrease symptoms and risks. You should talk to your caregiver about whether hormone replacement therapy is right for you.  Use sunscreen. Apply sunscreen liberally and repeatedly throughout the day. You should seek shade when your shadow is shorter than you. Protect yourself by wearing long sleeves, pants, a wide-brimmed hat, and sunglasses year round, whenever you are outdoors.  Notify your caregiver of new moles or changes in moles, especially if there is a change in shape or color. Also notify your caregiver if a mole is larger than the size of a pencil eraser.  Stay current with your immunizations. Document Released: 09/15/2010 Document Revised: 06/27/2012 Document Reviewed: 09/15/2010 [employer] Patient Information 2014 [place].

## 2013-06-16 NOTE — Assessment & Plan Note (Signed)
Likely a bartholins cyst. As it is not causing her significant symptoms and does not appear infected at this time, this can continue to be monitored. We have asked pt to return to GYN clinic if it becomes more of a problem, at which point we could discuss drainage or removal of the cyst.

## 2013-06-29 ENCOUNTER — Ambulatory Visit (HOSPITAL_COMMUNITY)
Admission: RE | Admit: 2013-06-29 | Discharge: 2013-06-29 | Disposition: A | Discharge status: Home / Self Care | Payer: Medicare Other | Source: Ambulatory Visit | Attending: Obstetrics & Gynecology | Admitting: Obstetrics & Gynecology

## 2013-06-29 DIAGNOSIS — Z1231 Encounter for screening mammogram for malignant neoplasm of breast: Secondary | ICD-10-CM

## 2013-06-30 ENCOUNTER — Other Ambulatory Visit: Payer: Self-pay | Admitting: Obstetrics & Gynecology

## 2013-06-30 DIAGNOSIS — N6452 Nipple discharge: Secondary | ICD-10-CM

## 2013-07-13 ENCOUNTER — Other Ambulatory Visit: Payer: Medicare Other

## 2013-07-14 NOTE — Addendum Note (Signed)
Addended by: Bufford SpikesFULCHER, Sheniece Ruggles N on: 07/14/2013 09:37 AM   Modules accepted: Orders

## 2013-07-25 ENCOUNTER — Ambulatory Visit
Admission: RE | Admit: 2013-07-25 | Discharge: 2013-07-25 | Disposition: A | Discharge status: Home / Self Care | Payer: Medicare Other | Source: Ambulatory Visit | Attending: Obstetrics & Gynecology | Admitting: Obstetrics & Gynecology

## 2013-07-25 DIAGNOSIS — N6452 Nipple discharge: Secondary | ICD-10-CM

## 2013-07-25 DIAGNOSIS — R928 Other abnormal and inconclusive findings on diagnostic imaging of breast: Secondary | ICD-10-CM | POA: Diagnosis not present

## 2013-07-31 ENCOUNTER — Ambulatory Visit (HOSPITAL_COMMUNITY): Payer: Medicare Other | Admitting: Psychiatry

## 2013-08-02 ENCOUNTER — Ambulatory Visit: Payer: Medicare Other | Admitting: Internal Medicine

## 2013-08-03 ENCOUNTER — Encounter: Payer: Self-pay | Admitting: Internal Medicine

## 2013-08-03 ENCOUNTER — Ambulatory Visit (HOSPITAL_COMMUNITY)
Admission: RE | Admit: 2013-08-03 | Discharge: 2013-08-03 | Disposition: A | Discharge status: Home / Self Care | Payer: Medicare Other | Source: Ambulatory Visit | Attending: Internal Medicine | Admitting: Internal Medicine

## 2013-08-03 ENCOUNTER — Ambulatory Visit (INDEPENDENT_AMBULATORY_CARE_PROVIDER_SITE_OTHER): Payer: Medicare Other | Admitting: Internal Medicine

## 2013-08-03 VITALS — BP 121/88 | HR 75 | Temp 97.8°F | Ht 67.0 in | Wt 311.3 lb

## 2013-08-03 DIAGNOSIS — M549 Dorsalgia, unspecified: Secondary | ICD-10-CM | POA: Diagnosis not present

## 2013-08-03 DIAGNOSIS — M19049 Primary osteoarthritis, unspecified hand: Secondary | ICD-10-CM | POA: Diagnosis not present

## 2013-08-03 DIAGNOSIS — J45909 Unspecified asthma, uncomplicated: Secondary | ICD-10-CM | POA: Diagnosis not present

## 2013-08-03 DIAGNOSIS — M545 Low back pain, unspecified: Secondary | ICD-10-CM | POA: Diagnosis not present

## 2013-08-03 DIAGNOSIS — M129 Arthropathy, unspecified: Secondary | ICD-10-CM

## 2013-08-03 DIAGNOSIS — M1990 Unspecified osteoarthritis, unspecified site: Secondary | ICD-10-CM

## 2013-08-03 LAB — BASIC METABOLIC PANEL WITH GFR
BUN: 9 mg/dL (ref 6–23)
CO2: 25 mEq/L (ref 19–32)
Calcium: 9.1 mg/dL (ref 8.4–10.5)
Chloride: 107 mEq/L (ref 96–112)
Creat: 0.83 mg/dL (ref 0.50–1.10)
GFR, Est Non African American: 88 mL/min
GLUCOSE: 99 mg/dL (ref 70–99)
POTASSIUM: 4.1 meq/L (ref 3.5–5.3)
Sodium: 139 mEq/L (ref 135–145)

## 2013-08-03 LAB — MAGNESIUM: Magnesium: 1.7 mg/dL (ref 1.5–2.5)

## 2013-08-03 MED ORDER — FLUTICASONE-SALMETEROL 250-50 MCG/DOSE IN AEPB: 1.0000 | INHALATION_SPRAY | Freq: Two times a day (BID) | RESPIRATORY_TRACT | Status: DC

## 2013-08-03 MED ORDER — TRAMADOL HCL 50 MG PO TBDP: 50.0000 mg | ORAL_TABLET | Freq: Two times a day (BID) | ORAL | Status: DC

## 2013-08-03 MED ORDER — ALBUTEROL SULFATE HFA 108 (90 BASE) MCG/ACT IN AERS: 2.0000 | INHALATION_SPRAY | Freq: Four times a day (QID) | RESPIRATORY_TRACT | Status: DC | PRN

## 2013-08-03 NOTE — Progress Notes (Signed)
Patient ID: Cassidy [name], female   DOB: 03/29/1970, 43 y.o.   MRN: [id number]   Subjective:   Patient ID: Cassidy [name] female   DOB: 1970/06/14 43 y.o.   MRN: [id number]  HPI: Cassidy [name] is a 43 y.o. PMH of Asthma, Arthritis,morbid obesity presented today with c/o multiple joint pain- feet, both knees, left elbow, lower back, also some intermittent cramps in her toes.  Pt presented previously in November for same complaints, but workup was negative- RF, ANA, ESR, except for elevation in CRP- 1.2. Pt describes popping sensation in both knees, stiffness in the mornings that gets better within an hour when she goes about her daily activities. Knee x-rays in 01/2013 showed early degenerative change. Patient also complains of lower back pain, chronic, no shooting pains down her legs, no numbness or weakness of her lower extremities, no saddle anesthesia. Patient says over the past year and a half, joint pains have become more severe, she has also gained the most weight over the past years and a half- about 40 pounds. Patient has been taking over 6 tablets of naproxen daily with Motrin, and then has some control of her multiple joint pains. Patient also endorses some mild epigastric discomfort that has developed over the past year since she has been taken NSAIDs- denies hematemesis or melena.    Past Medical History  Diagnosis Date  . Asthma   . Arthritis    Current Outpatient Prescriptions  Medication Sig Dispense Refill  . albuterol (PROVENTIL HFA;VENTOLIN HFA) 108 (90 BASE) MCG/ACT inhaler Inhale 2 puffs into the lungs every 6 (six) hours as needed for wheezing or shortness of breath.  1 Inhaler  11  . budesonide-formoterol (SYMBICORT) 80-4.5 MCG/ACT inhaler Inhale 2 puffs into the lungs 2 (two) times daily.  1 Inhaler  12  . fluticasone (FLONASE) 50 MCG/ACT nasal spray Place 1 spray into both nostrils daily.  16 g  2  . Fluticasone-Salmeterol (ADVAIR) 250-50 MCG/DOSE AEPB Inhale 1 puff into  the lungs 2 (two) times daily.  60 each  10  . naproxen (NAPROSYN) 500 MG tablet Take 1 tablet (500 mg total) by mouth 2 (two) times daily as needed.  60 tablet  3  . Omeprazole 20 MG TBEC Take 1 tablet (20 mg total) by mouth daily.  30 each  3   No current facility-administered medications for this visit.   Family History  Problem Relation Age of Onset  . Asthma Sister   . Asthma Brother   . Asthma Maternal Grandmother   . Asthma Maternal Grandfather   . Prostate cancer Brother   . Colon cancer Brother 77    Diet about 10 years before. Was diagnosed in late 70s.  . Throat cancer Paternal Uncle    History   Social History  . Marital Status: Single    Spouse Name: N/A    Number of Children: N/A  . Years of Education: N/A   Social History Main Topics  . Smoking status: Never Smoker   . Smokeless tobacco: Never Used  . Alcohol Use: No  . Drug Use: No  . Sexual Activity: Yes    Birth Control/ Protection: Surgical   Other Topics Concern  . None   Social History Narrative   She is married and has 5 kids. 3 of them has asthma.   3 boys and 2 girls. Oldest one is 41 year old and youngest 41 years old.      Moved to [place] from [place] in January 2014.  Review of Systems: CONSTITUTIONAL- No Fever, weightloss, night sweat or change in appetite. SKIN- Three small circular rash- epigastric area, previous episode- resolved spontaneously, mildy itchy. HEAD- No Headache or dizziness. EYES- No Vision loss, pain, redness, double or blurred vision. EARS- No vertigo, hearing loss or ear discharge. Mouth/throat- No Sorethroat, dentures, or bleeding gums. RESPIRATORY- No Cough or SOB. CARDIAC- No Palpitations, DOE, PND or chest pain. GI- No nausea, vomiting, diarrhoea, constipation, abd pain. URINARY- No Frequency, urgency, straining or dysuria. NEUROLOGIC- No Numbness, syncope, seizures or burning. [facility]- Denies depression or anxiety.  Objective:  Physical Exam: Filed  Vitals:   08/03/13 0848  BP: 121/88  Pulse: 75  Temp: 97.8 F (36.6 C)  TempSrc: Oral  Height: _0  (1.702 m)  Weight: 311 lb 4.8 oz (141.205 kg)  SpO2: 99%   GENERAL- alert, co-operative, appears as stated age, not in any distress. HEENT- Atraumatic, normocephalic, PERRL, EOMI, oral mucosa appears moist, good and intact dentition. No carotid bruit, no cervical LN enlargement, thyroid does not appear enlarged, neck supple. CARDIAC- RRR, no murmurs, rubs or gallops. RESP- Moving equal volumes of air, and clear to auscultation bilaterally, no wheezes or crackles. ABDOMEN- Soft, nontender, no palpable masses or organomegaly, bowel sounds present. BACK- Normal curvature of the spine, No tenderness along the vertebrae, no CVA tenderness. NEURO- No obvious Cr N abnormality, strenght upper and lower extremities- 5/5, Gait- Normal. EXTREMITIES- pulse 2+, symmetric, no pedal edema. SKIN- Warm, dry, No rash or lesion. PSYCH- Normal mood and affect, appropriate thought content and speech.   Assessment & Plan:   The patient's case and plan of care was discussed with attending physician, Dr. Chinita Pester.  Please see problem based charting for assessment and plan.

## 2013-08-03 NOTE — Assessment & Plan Note (Addendum)
Joint pain- multiple, mostly lower extremity,  Persistent. Workup- so far negative. Also complaints of lower back pain, lower extremity cramping. Takes several tablets of Naproxen and motrin daily- having mild epigastric discomfort, but without hematemesis or melena, or hematochezia. Pt is on omeprazole- 20mg  daily. Pt weight down to 311 today from 320 in march, pt understands that her weight is the reason for the arthritis, and is working on getting the weigh off.   Plan- Xray [facility] spine. - Mag, BMP- Ca level, r/o out  - Continue Omeprazole 40mg   - Stop NSAIDS, counseled pt about the risk of having PUD. - Trial of tramadol, 50mg  BID, #30 tabs given explained this was only as needed. And that patient has to continue to work on her weight. This was the only reason why she was been given tramadol. - Appointment in 5 weeks, to access continued weight loss. Pt cannot receive any more doses of tramadol if she dosen't continue to work on her weight. 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

## 2013-08-03 NOTE — Patient Instructions (Addendum)
General Instructions: We will be prescribing a medication called Tramadol, we will give you 30 pills trial, please take these only as needed. You have to continue working on your weight. That is why we are giving you this medication. The weight is the reason you are having the joint pains as you well know. For now do not take naproxen or motrin or alieve or ibuprofen anymore.  Please please continue to work on your weight. Big Congratulations on loosing 9 Lbs so far.   Also continue taking the omeprazole or prilosec for your abdominal pain, it is very likely the high dose of naproxen with motrin you are taking is causing some erosion in your stomach wall.  Please bring your medicines with you each time you come to clinic.  Medicines may include prescription medications, over-the-counter medications, herbal remedies, eye drops, vitamins, or other pills.

## 2013-08-03 NOTE — Progress Notes (Signed)
Case discussed with Dr. Emokpae at the time of the visit.  We reviewed the resident's history and exam and pertinent patient test results.  I agree with the assessment, diagnosis, and plan of care documented in the resident's note. 

## 2013-08-04 DIAGNOSIS — Z800 Family history of malignant neoplasm of digestive organs: Secondary | ICD-10-CM | POA: Diagnosis not present

## 2013-08-04 DIAGNOSIS — Z1211 Encounter for screening for malignant neoplasm of colon: Secondary | ICD-10-CM | POA: Diagnosis not present

## 2013-08-04 DIAGNOSIS — K648 Other hemorrhoids: Secondary | ICD-10-CM | POA: Diagnosis not present

## 2013-08-10 ENCOUNTER — Encounter: Payer: Self-pay | Admitting: *Deleted

## 2013-08-17 ENCOUNTER — Encounter: Payer: Self-pay | Admitting: *Deleted

## 2013-09-07 ENCOUNTER — Ambulatory Visit: Payer: Medicare Other | Admitting: Internal Medicine

## 2013-09-25 ENCOUNTER — Encounter: Payer: Self-pay | Admitting: Internal Medicine

## 2013-09-25 ENCOUNTER — Ambulatory Visit (INDEPENDENT_AMBULATORY_CARE_PROVIDER_SITE_OTHER): Payer: Medicare Other | Admitting: Internal Medicine

## 2013-09-25 VITALS — BP 130/82 | HR 82 | Temp 100.9°F | Wt 307.5 lb

## 2013-09-25 DIAGNOSIS — M549 Dorsalgia, unspecified: Secondary | ICD-10-CM | POA: Diagnosis not present

## 2013-09-25 DIAGNOSIS — M25569 Pain in unspecified knee: Secondary | ICD-10-CM | POA: Diagnosis not present

## 2013-09-25 DIAGNOSIS — R51 Headache: Secondary | ICD-10-CM | POA: Diagnosis not present

## 2013-09-25 DIAGNOSIS — R197 Diarrhea, unspecified: Secondary | ICD-10-CM | POA: Diagnosis not present

## 2013-09-25 DIAGNOSIS — N9089 Other specified noninflammatory disorders of vulva and perineum: Secondary | ICD-10-CM | POA: Diagnosis not present

## 2013-09-25 DIAGNOSIS — J45909 Unspecified asthma, uncomplicated: Secondary | ICD-10-CM | POA: Diagnosis not present

## 2013-09-25 DIAGNOSIS — G44209 Tension-type headache, unspecified, not intractable: Secondary | ICD-10-CM | POA: Insufficient documentation

## 2013-09-25 DIAGNOSIS — M129 Arthropathy, unspecified: Secondary | ICD-10-CM | POA: Diagnosis not present

## 2013-09-25 DIAGNOSIS — M19049 Primary osteoarthritis, unspecified hand: Secondary | ICD-10-CM | POA: Diagnosis not present

## 2013-09-25 DIAGNOSIS — E559 Vitamin D deficiency, unspecified: Secondary | ICD-10-CM | POA: Diagnosis not present

## 2013-09-25 DIAGNOSIS — R0609 Other forms of dyspnea: Secondary | ICD-10-CM | POA: Diagnosis not present

## 2013-09-25 DIAGNOSIS — R0989 Other specified symptoms and signs involving the circulatory and respiratory systems: Secondary | ICD-10-CM | POA: Diagnosis not present

## 2013-09-25 DIAGNOSIS — Z0000 Encounter for general adult medical examination without abnormal findings: Secondary | ICD-10-CM | POA: Diagnosis not present

## 2013-09-25 LAB — COMPLETE METABOLIC PANEL WITH GFR
ALBUMIN: 4.1 g/dL (ref 3.5–5.2)
ALK PHOS: 87 U/L (ref 39–117)
ALT: 17 U/L (ref 0–35)
AST: 16 U/L (ref 0–37)
BILIRUBIN TOTAL: 0.4 mg/dL (ref 0.2–1.2)
BUN: 9 mg/dL (ref 6–23)
CO2: 26 mEq/L (ref 19–32)
Calcium: 9.1 mg/dL (ref 8.4–10.5)
Chloride: 103 mEq/L (ref 96–112)
Creat: 0.9 mg/dL (ref 0.50–1.10)
GFR, EST NON AFRICAN AMERICAN: 80 mL/min
GFR, Est African American: >89 mL/min
Glucose, Bld: 84 mg/dL (ref 70–99)
Potassium: 4.3 mEq/L (ref 3.5–5.3)
SODIUM: 135 meq/L (ref 135–145)
TOTAL PROTEIN: 6.9 g/dL (ref 6.0–8.3)

## 2013-09-25 LAB — CBC WITH DIFFERENTIAL/PLATELET
BASOS ABS: 0 10*3/uL (ref 0.0–0.1)
Basophils Relative: 0 % (ref 0–1)
EOS PCT: 0 % (ref 0–5)
Eosinophils Absolute: 0 10*3/uL (ref 0.0–0.7)
HEMATOCRIT: 33.1 % — AB (ref 36.0–46.0)
HEMOGLOBIN: 10.9 g/dL — AB (ref 12.0–15.0)
Lymphocytes Relative: 27 % (ref 12–46)
Lymphs Abs: 1.9 10*3/uL (ref 0.7–4.0)
MCH: 24.8 pg — ABNORMAL LOW (ref 26.0–34.0)
MCHC: 32.9 g/dL (ref 30.0–36.0)
MCV: 75.4 fL — AB (ref 78.0–100.0)
MONO ABS: 0.5 10*3/uL (ref 0.1–1.0)
MONOS PCT: 7 % (ref 3–12)
NEUTROS ABS: 4.6 10*3/uL (ref 1.7–7.7)
Neutrophils Relative %: 66 % (ref 43–77)
Platelets: 392 10*3/uL (ref 150–400)
RBC: 4.39 MIL/uL (ref 3.87–5.11)
RDW: 16.6 % — AB (ref 11.5–15.5)
WBC: 6.9 10*3/uL (ref 4.0–10.5)

## 2013-09-25 NOTE — Patient Instructions (Signed)
Thank you for coming to clinic today Ms. Tool.  General instructions: -It is most likely that your diarrhea is caused by a viral infection.  This should resolve by itself over the next couple of days. -In the meantime, stay well hydrated by getting plenty of water.  Occasionally drinking gatorade is a good idea as well to replenish your electrolytes. -You headache is likely a tension headache that is aggravated by stress.  Continue to take Motrin as needed up to a maximum of 3200 mg per day. -Please make a follow up appointment to return to clinic in 2 weeks.  Please bring your medicines with you each time you come.   Medicines may be  Eye drops  Herbal   Vitamins  Pills  Seeing these help us take care of you.

## 2013-09-25 NOTE — Progress Notes (Signed)
   Subjective:    Patient ID: Cassidy [name], female    DOB: March 16, 1971, 43 y.o.   MRN: [id number]  HPI Comments: Cassidy [name] is a 43 year old female with a history of asthma, arthritis, and morbid obesity presenting for headaches and diarrhea.  She reports having headaches for the last month, and she says she doesn't usually get headaches.  She also reports light-headedness and chills today.  This morning at 5 am she developed diarrhea.  Last night she had cramping in her stomach off and on.  She has had yellow-green diarrhea 8-9 times today.  She denies any blood.  She hasn't had anything to eat today except for tea because she has been nauseous.  She has not vomited.  Her husband was also feeling sick last night and vomited around 9, but he is feeling better today and accompanies the patient.  She feels like she had a fever today, but she couldn't check her temperature.  She denies eating anything strange.  She made fried chicken, corn, and biscuits for dinner last night.  5 other family members and friends shared dinner last night and felt fine.  She tried some baking soda and gingerale for her diarrhea, but it did not help.  She has been taking Motrin for her headaches, but it doesn't improve until she goes to sleep.  She has the headaches at least once a day, some days more than others.  They are bilateral above her eyes up to the top of her head and are of an achy quality.  She started back to school in May and notes increased stress since then.     Review of Systems  Constitutional: Positive for fever.  Respiratory: Positive for cough. Negative for chest tightness, shortness of breath and wheezing.   Cardiovascular: Negative for chest pain and palpitations.  Gastrointestinal: Positive for nausea and diarrhea. Negative for vomiting and constipation.  Genitourinary: Negative for dysuria, hematuria and difficulty urinating.  Musculoskeletal: Positive for arthralgias and myalgias. Negative  for neck stiffness.  Skin: Positive for rash (Few months ago, resolved.).  Neurological: Positive for light-headedness and headaches.  Psychiatric/Behavioral: Negative for agitation.       Objective:   Physical Exam  Constitutional: She is oriented to person, place, and time. She appears well-developed and well-nourished. She appears distressed.  HENT:  Head: Normocephalic and atraumatic.  Mouth/Throat: No oropharyngeal exudate.  Oropharynx slightly dry.  Eyes: Conjunctivae and EOM are normal. Pupils are equal, round, and reactive to light. No scleral icterus.  Neck: Normal range of motion. Neck supple. No thyromegaly present.  Cardiovascular: Normal rate, regular rhythm, normal heart sounds and intact distal pulses.  Exam reveals no gallop and no friction rub.   No murmur heard. Pulmonary/Chest: Effort normal and breath sounds normal. No respiratory distress. She has no wheezes. She has no rales.  Abdominal: Soft. She exhibits no distension. There is no tenderness. There is no rebound.  Hyperactive bowel sounds.  Musculoskeletal: Normal range of motion. She exhibits no edema and no tenderness.  Lymphadenopathy:    She has no cervical adenopathy.  Neurological: She is alert and oriented to person, place, and time. No cranial nerve deficit. Coordination normal.  Skin: Skin is warm and dry. No rash noted. She is not diaphoretic. No erythema. No pallor.  Psychiatric: She has a normal mood and affect.          Assessment & Plan:  Please see problem-based assessment and plan.

## 2013-09-25 NOTE — Assessment & Plan Note (Signed)
She reports watery, non-bloody diarrhea for the last 12 hours with low-grade fever in clinic.  Her husband also had diarrhea last night, making viral gastroenteritis most likely.  Slightly dry mucous membranes, but no other signs of volume depletion.  Will check labs and treat symptomatically. -Stay well hydrated with water and electrolytes.  Encouraged patient to eat light meals and avoid greasy and fatty meals. -CBC with diff, and CMP.

## 2013-09-25 NOTE — Assessment & Plan Note (Signed)
Bilateral frontal headache for past month with no migraine associated symptoms, most consistent with tension headaches.  Patient does report increased stress since going back to school.  May be aggravated currently by dehydration. -Continue Motrin 800 mg q6h PRN. -Return to clinic in 2 weeks for reassessment.

## 2013-09-26 ENCOUNTER — Encounter: Payer: Self-pay | Admitting: Internal Medicine

## 2013-09-26 DIAGNOSIS — D649 Anemia, unspecified: Secondary | ICD-10-CM | POA: Insufficient documentation

## 2013-09-26 NOTE — Progress Notes (Signed)
INTERNAL MEDICINE TEACHING ATTENDING ADDENDUM - Aldine Contes, MD: I personally saw and evaluated Ms. Borawski in this clinic visit in conjunction with the resident, Dr. Trudee Kuster. I have discussed patient's plan of care with medical resident during this visit. I have confirmed the physical exam findings and have read and agree with the clinic note including the plan with the following addition: - Patient denies visual changes/jaw claudication - On exam - no temporal artery tenderness - extensive discussion held with patient and family - Patient with likely tension headaches. Will follow up in 2 weeks - if persistent or worsening headaches pt instructed to follow up sooner and will likely need imaging (CT head) and ESR - Pt with likely acute gastroenteritis given + sic contacts, resolving diarrhea. Supportive care for now - Patient in agreement with plan

## 2013-10-04 ENCOUNTER — Other Ambulatory Visit: Payer: Self-pay | Admitting: Internal Medicine

## 2013-10-10 ENCOUNTER — Encounter: Payer: Self-pay | Admitting: Internal Medicine

## 2013-10-10 ENCOUNTER — Ambulatory Visit (INDEPENDENT_AMBULATORY_CARE_PROVIDER_SITE_OTHER): Payer: Medicare Other | Admitting: Internal Medicine

## 2013-10-10 VITALS — BP 128/83 | HR 70 | Temp 97.0°F | Ht 67.0 in | Wt 306.3 lb

## 2013-10-10 DIAGNOSIS — M129 Arthropathy, unspecified: Secondary | ICD-10-CM | POA: Diagnosis not present

## 2013-10-10 DIAGNOSIS — N9089 Other specified noninflammatory disorders of vulva and perineum: Secondary | ICD-10-CM | POA: Diagnosis not present

## 2013-10-10 DIAGNOSIS — M19049 Primary osteoarthritis, unspecified hand: Secondary | ICD-10-CM | POA: Diagnosis not present

## 2013-10-10 DIAGNOSIS — J45909 Unspecified asthma, uncomplicated: Secondary | ICD-10-CM | POA: Diagnosis not present

## 2013-10-10 DIAGNOSIS — D649 Anemia, unspecified: Secondary | ICD-10-CM | POA: Diagnosis not present

## 2013-10-10 DIAGNOSIS — Z0000 Encounter for general adult medical examination without abnormal findings: Secondary | ICD-10-CM | POA: Diagnosis not present

## 2013-10-10 DIAGNOSIS — E559 Vitamin D deficiency, unspecified: Secondary | ICD-10-CM | POA: Diagnosis not present

## 2013-10-10 DIAGNOSIS — M1990 Unspecified osteoarthritis, unspecified site: Secondary | ICD-10-CM

## 2013-10-10 DIAGNOSIS — M549 Dorsalgia, unspecified: Secondary | ICD-10-CM | POA: Diagnosis not present

## 2013-10-10 DIAGNOSIS — R51 Headache: Secondary | ICD-10-CM

## 2013-10-10 DIAGNOSIS — R0609 Other forms of dyspnea: Secondary | ICD-10-CM | POA: Diagnosis not present

## 2013-10-10 DIAGNOSIS — J4520 Mild intermittent asthma, uncomplicated: Secondary | ICD-10-CM

## 2013-10-10 DIAGNOSIS — M25569 Pain in unspecified knee: Secondary | ICD-10-CM | POA: Diagnosis not present

## 2013-10-10 DIAGNOSIS — R197 Diarrhea, unspecified: Secondary | ICD-10-CM | POA: Diagnosis not present

## 2013-10-10 LAB — IRON AND TIBC
%SAT: 7 % — AB (ref 20–55)
IRON: 24 ug/dL — AB (ref 42–145)
TIBC: 340 ug/dL (ref 250–470)
UIBC: 316 ug/dL (ref 125–400)

## 2013-10-10 MED ORDER — TRAMADOL HCL 50 MG PO TBDP: 50.0000 mg | ORAL_TABLET | Freq: Two times a day (BID) | ORAL | Status: DC

## 2013-10-10 NOTE — Assessment & Plan Note (Signed)
Stable and well-controlled on Advair.  Rarely using albuterol rescue inhaler. -Continue Advair 1 puff BID. -Albuterol 2 puffs PRN.

## 2013-10-10 NOTE — Progress Notes (Signed)
Subjective:    Patient ID: Cassidy [name], female    DOB: [birth year], 43 y.o.   MRN: [id number]  HPI Comments: Cassidy [name] is a 43 year old female with a history of asthma, arthritis, and morbid obesity presenting for follow up of her headaches.  She was seen in clinic two weeks ago for gastroenteritis and tension-type headaches.  She was also noted to have a microcytic anemia with a hemoglobin of 10.9. She says that her diarrhea lasted for two days and then resolved.  She reports still having headaches, but they are significantly less than they were before.  She continues to take Motrin, which generally helps, and the headaches go away after 15 minutes or so. She says the headaches are 5-6/10 currently, and she had 2 or 3 in the last week.  Her most recent headache was two days ago.  The headaches continue to be bilateral above her eyes, but they do not extend up her head as far.  She thinks they are related to stress, and she has had less stress the last week or so.  She continues to spend a lot of time reading books and staring at computer for school, which may be contributing.  She has had arthritis in her knees, ankles, hands, and elbows for 7-8 years.  She has been tramadol for her arthritis for the past couple of months.  She uses the tramadol four times per week, and she thinks it helps relieve her pain and allows her to do her normal activities.  Her asthma is well-controlled, and she rarely has trouble breathing.  She uses her Advair every day and has not needed her albuterol in over a month.  She has a history of anemia, and she has been on iron supplementation in the past.  She last took iron supplements two years ago, but stopped because she did not need it.     Review of Systems  Constitutional: Positive for chills. Negative for fever, activity change, appetite change and fatigue.  HENT: Negative for congestion and rhinorrhea.   Respiratory: Negative for cough, chest tightness,  shortness of breath and wheezing.   Cardiovascular: Negative for chest pain and leg swelling.  Gastrointestinal: Negative for nausea, vomiting, diarrhea, constipation, blood in stool and abdominal distention.  Genitourinary: Negative for dysuria, frequency, hematuria and difficulty urinating.  Musculoskeletal: Positive for arthralgias and myalgias.  Skin: Negative for rash.  Neurological: Positive for numbness (In right hand for several years, last a week ago.  Occurs in mornings, but not currently bothering her.) and headaches. Negative for dizziness and weakness.  Psychiatric/Behavioral: Negative for dysphoric mood and agitation.       Objective:   Physical Exam  Constitutional: She appears well-developed and well-nourished. No distress.  Overweight.  HENT:  Head: Normocephalic and atraumatic.  Mouth/Throat: Oropharynx is clear and moist.  Eyes: Conjunctivae and EOM are normal. Pupils are equal, round, and reactive to light.  Neck: Normal range of motion. Neck supple.  Cardiovascular: Normal rate, regular rhythm, normal heart sounds and intact distal pulses.   Pulmonary/Chest: Effort normal and breath sounds normal.  Abdominal: Soft. She exhibits no distension. There is no tenderness.  Musculoskeletal: Normal range of motion. She exhibits no edema.  Tinel negative.  Lymphadenopathy:    She has no cervical adenopathy.  Neurological: She is alert. No cranial nerve deficit. Coordination normal.  Skin: Skin is warm and dry. No rash noted. She is not diaphoretic.  Psychiatric: She has a normal mood and  affect.        Assessment & Plan:  Please see problem-based assessment and plan.

## 2013-10-10 NOTE — Assessment & Plan Note (Addendum)
Joint pain in multiple joints with negative workup (ESR, rheumatoid factor, ANA) in last year.  This is most likely related to her being overweight and appears to be worse in weight-bearing joints.  She is trying to lose weight, and has lost 5 pounds since May.  She had some mild epigastric discomfort on Naproxen and was given a trial of tramadol with the goal of losing weight.  She has been using the Tramadol very minimally (30 tabs in last two months), but she thinks it helps her complete her daily activities. -Encouraged patient to continue losing weight. -Refilled tramadol 50 mg BID PRN.  Will reassess need for medication at next appointment and stop if weight loss not continued.

## 2013-10-10 NOTE — Assessment & Plan Note (Addendum)
Bilateral frontal headaches related to stress, consistent with tension headaches.  The headaches have decreased in frequency and severity over the last two weeks as the patient may be getting use to more frequent reading for school.  She also reports decreased stress at home over the last two weeks.  Discussed a acetaminophen/aspirin/caffeine combination pill, but she would like to continue Motrin and try drinking caffeineated tea. -Continue Motrin 400 mg q6h PRN. -Try drinking caffeine if Motrin does not help and not too close to bed time.  --Addendum-- Luisa DagoEverett Germaine Shenker, MD, PhD Internal Medicine Intern Pager: [phone] 10/11/2013,6:01 PM  Patient noted taking two over the counter pills of ibuprofen (400 mg total, not 800 mg as I noted previously) when she has headaches.  She has been using them up to two times per day, but she can use them up to Q6H PRN. -Updated medication list to reflect this correction. -Called the patient to clarify that she should not be taking 800 mg Q6H PRN.

## 2013-10-10 NOTE — Patient Instructions (Addendum)
Thank you for coming to clinic today Cassidy [name].  General instructions: -I am glad to hear your headaches are improving.  They are most likely tension-type headaches.  Let us know if they get worse or you develop new weakness or numbness. -You can continue to take Motrin when you have headaches.  You can also try drinking some tea with caffeine as long as this does not interfere with your sleep. -We would like to check some labs today to determine if your iron levels are low. I will let you know if you need to start iron supplementation. -Please make a follow up appointment to return to clinic in 6 months.  Please bring your medicines with you each time you come.   Medicines may be  Eye drops  Herbal   Vitamins  Pills  Seeing these help us take care of you.

## 2013-10-10 NOTE — Assessment & Plan Note (Addendum)
Patient has mild microcytic anemia noted on CBC 09/26/13.  She reports a history of anemia and had been on iron supplementation.  This is likely iron-deficiency anemia secondary to menstrual losses, but no ferritin noted in chart.  Will check labs today and consider supplementing iron again.  Likely asymptomatic with Hgb of 10.9, but could potentially contribute to headaches. -Ferritin, iron, TIBC.  --Addendum-- Luisa DagoEverett Kastiel Simonian, MD, PhD Internal Medicine Intern Pager: [phone] 10/11/2013,9:03 AM  Patient has a ferritin of 9 with transferrin saturation of 7% consistent with iron-deficiency anemia, likely related to menstruation losses. -Start ferrous sulfate 325 mg TID WC. -Called patient and told to pick up at pharmacy.

## 2013-10-11 LAB — FERRITIN: Ferritin: 9 ng/mL — ABNORMAL LOW (ref 10–291)

## 2013-10-11 MED ORDER — FERROUS SULFATE 325 (65 FE) MG PO TABS: 325.0000 mg | ORAL_TABLET | Freq: Three times a day (TID) | ORAL | Status: DC

## 2013-10-11 NOTE — Addendum Note (Signed)
Addended by: Donavan FoilMODING, Iyonnah Ferrante J on: 10/11/2013 09:10 AM   Modules accepted: Orders

## 2013-10-11 NOTE — Addendum Note (Signed)
Addended by: Donavan FoilMODING, Manolito Jurewicz J on: 10/11/2013 09:17 AM   Modules accepted: Orders

## 2013-10-12 NOTE — Progress Notes (Signed)
I saw and evaluated the patient.  I personally confirmed the key portions of the history and exam documented by Dr. Moding and I reviewed pertinent patient test results.  The assessment, diagnosis, and plan were formulated together and I agree with the documentation in the resident's note. 

## 2013-10-17 ENCOUNTER — Ambulatory Visit (HOSPITAL_COMMUNITY): Payer: Medicare Other | Admitting: Psychiatry

## 2013-10-18 ENCOUNTER — Ambulatory Visit (HOSPITAL_COMMUNITY): Payer: Medicare Other | Admitting: Licensed Clinical Social Worker

## 2013-10-20 ENCOUNTER — Ambulatory Visit (HOSPITAL_COMMUNITY): Payer: Medicare Other | Admitting: Licensed Clinical Social Worker

## 2013-10-24 ENCOUNTER — Ambulatory Visit (HOSPITAL_COMMUNITY): Payer: Medicare Other | Admitting: Licensed Clinical Social Worker

## 2013-12-13 ENCOUNTER — Ambulatory Visit (INDEPENDENT_AMBULATORY_CARE_PROVIDER_SITE_OTHER): Payer: Medicare Other | Admitting: Internal Medicine

## 2013-12-13 ENCOUNTER — Encounter: Payer: Self-pay | Admitting: Internal Medicine

## 2013-12-13 ENCOUNTER — Ambulatory Visit (HOSPITAL_COMMUNITY)
Admission: RE | Admit: 2013-12-13 | Discharge: 2013-12-13 | Disposition: A | Discharge status: Home / Self Care | Payer: Medicare Other | Source: Ambulatory Visit | Attending: Internal Medicine | Admitting: Internal Medicine

## 2013-12-13 VITALS — BP 118/58 | HR 60 | Temp 98.0°F | Ht 67.0 in | Wt 311.6 lb

## 2013-12-13 DIAGNOSIS — R51 Headache: Secondary | ICD-10-CM | POA: Diagnosis not present

## 2013-12-13 DIAGNOSIS — M25569 Pain in unspecified knee: Secondary | ICD-10-CM

## 2013-12-13 DIAGNOSIS — D509 Iron deficiency anemia, unspecified: Secondary | ICD-10-CM

## 2013-12-13 DIAGNOSIS — M25562 Pain in left knee: Secondary | ICD-10-CM

## 2013-12-13 DIAGNOSIS — N9089 Other specified noninflammatory disorders of vulva and perineum: Secondary | ICD-10-CM | POA: Diagnosis not present

## 2013-12-13 DIAGNOSIS — Z0000 Encounter for general adult medical examination without abnormal findings: Secondary | ICD-10-CM | POA: Diagnosis not present

## 2013-12-13 DIAGNOSIS — L989 Disorder of the skin and subcutaneous tissue, unspecified: Secondary | ICD-10-CM | POA: Diagnosis not present

## 2013-12-13 DIAGNOSIS — M25561 Pain in right knee: Secondary | ICD-10-CM

## 2013-12-13 DIAGNOSIS — N3941 Urge incontinence: Secondary | ICD-10-CM

## 2013-12-13 DIAGNOSIS — D649 Anemia, unspecified: Secondary | ICD-10-CM | POA: Diagnosis not present

## 2013-12-13 DIAGNOSIS — M129 Arthropathy, unspecified: Secondary | ICD-10-CM | POA: Diagnosis not present

## 2013-12-13 DIAGNOSIS — R197 Diarrhea, unspecified: Secondary | ICD-10-CM | POA: Diagnosis not present

## 2013-12-13 DIAGNOSIS — Z6841 Body Mass Index (BMI) 40.0 and over, adult: Secondary | ICD-10-CM

## 2013-12-13 DIAGNOSIS — M1990 Unspecified osteoarthritis, unspecified site: Secondary | ICD-10-CM

## 2013-12-13 DIAGNOSIS — E559 Vitamin D deficiency, unspecified: Secondary | ICD-10-CM | POA: Diagnosis not present

## 2013-12-13 DIAGNOSIS — M549 Dorsalgia, unspecified: Secondary | ICD-10-CM | POA: Diagnosis not present

## 2013-12-13 DIAGNOSIS — M19049 Primary osteoarthritis, unspecified hand: Secondary | ICD-10-CM | POA: Diagnosis not present

## 2013-12-13 DIAGNOSIS — J45909 Unspecified asthma, uncomplicated: Secondary | ICD-10-CM | POA: Diagnosis not present

## 2013-12-13 DIAGNOSIS — R0609 Other forms of dyspnea: Secondary | ICD-10-CM | POA: Diagnosis not present

## 2013-12-13 LAB — CBC
HCT: 34.1 % — ABNORMAL LOW (ref 36.0–46.0)
Hemoglobin: 10.8 g/dL — ABNORMAL LOW (ref 12.0–15.0)
MCH: 24.8 pg — ABNORMAL LOW (ref 26.0–34.0)
MCHC: 31.7 g/dL (ref 30.0–36.0)
MCV: 78.2 fL (ref 78.0–100.0)
Platelets: 379 10*3/uL (ref 150–400)
RBC: 4.36 MIL/uL (ref 3.87–5.11)
RDW: 17.4 % — ABNORMAL HIGH (ref 11.5–15.5)
WBC: 8.5 10*3/uL (ref 4.0–10.5)

## 2013-12-13 MED ORDER — TRAMADOL HCL 50 MG PO TBDP: 50.0000 mg | ORAL_TABLET | Freq: Two times a day (BID) | ORAL | Status: DC

## 2013-12-13 MED ORDER — FLUOCINONIDE 0.05 % EX CREA: 1.0000 "application " | TOPICAL_CREAM | Freq: Two times a day (BID) | CUTANEOUS | Status: DC

## 2013-12-13 MED ORDER — FERROUS SULFATE 325 (65 FE) MG PO TABS
325.0000 mg | ORAL_TABLET | Freq: Three times a day (TID) | ORAL | Status: DC
Start: 2013-12-13 — End: 2017-10-01

## 2013-12-13 NOTE — Assessment & Plan Note (Signed)
Patient with iron deficiency anemia, last evaluated in July 2015. Patient denies any current symptoms of lightheadedness, fatigue.  - Will recheck CBC today.

## 2013-12-13 NOTE — Progress Notes (Signed)
   Subjective:    Patient ID: Cassidy [name], female    DOB: Oct 31, 1970, 43 y.o.   MRN: [id number]  Cassidy [name] is a 43 year old female with a history of asthma, arthritis, obesity, urge incontinence (status post surgical procedures for rectal and uterine prolapse) who presents to clinic for a routine followup.  Please refer to separate problem-list charting for more details.   Hand Pain  Pertinent negatives include no chest pain.  Knee Pain   Cough Associated symptoms include shortness of breath (with exertion). Pertinent negatives include no chest pain, chills, fever, rhinorrhea or sore throat.      Review of Systems  Constitutional: Negative for fever and chills.  HENT: Negative for rhinorrhea and sore throat.   Eyes: Negative for visual disturbance.  Respiratory: Positive for cough, chest tightness and shortness of breath (with exertion).   Cardiovascular: Negative for chest pain and palpitations.  Gastrointestinal: Negative for nausea, vomiting, abdominal pain, diarrhea, constipation and blood in stool.  Genitourinary: Negative for dysuria and hematuria.  Neurological: Negative for syncope.       Objective:   Physical Exam  Constitutional: She is oriented to person, place, and time. She appears well-developed and well-nourished. No distress.  HENT:  Head: Normocephalic and atraumatic.  Eyes: EOM are normal. Pupils are equal, round, and reactive to light. Left eye exhibits no discharge.  Neck: Normal range of motion. Neck supple. No thyromegaly present.  Cardiovascular: Normal rate and regular rhythm.  Exam reveals no gallop and no friction rub.   No murmur heard. Pulmonary/Chest: Effort normal and breath sounds normal. No respiratory distress. She has no wheezes. She has no rales.  Abdominal: Soft. Bowel sounds are normal. She exhibits no distension. There is no tenderness. There is no rebound.  Musculoskeletal: She exhibits tenderness (upon palpation of bilateral knees  and ankles). She exhibits no edema.  Normal range of motion in hands, wrists, knees, and ankles  Neurological: She is alert and oriented to person, place, and time. No cranial nerve deficit.  Skin: Skin is warm and dry. Rash noted. Pallor:  hyperpigmented 1 cm faint spots on medial aspect of right foot.  Psychiatric: She has a normal mood and affect. Thought content normal.   Filed Vitals:   12/13/13 0855  BP: 118/58  Pulse: 60  Temp: 98 F (36.7 C)       Assessment & Plan:  Please refer to separate problem-list charting for more details.

## 2013-12-13 NOTE — Patient Instructions (Addendum)
We have referred you to a nutritionist. We have also refilled your tramadol to help with the pain. We will also contact you with any abnormal results for your hand x-ray or blood work that would require change to medications. We will also refer you to a urologist to follow up about the urge incontinence.   General Instructions:   Please bring your medicines with you each time you come to clinic.  Medicines may include prescription medications, over-the-counter medications, herbal remedies, eye drops, vitamins, or other pills.   Progress Toward Treatment Goals:  No flowsheet data found.  Self Care Goals & Plans:  No flowsheet data found.  No flowsheet data found.   Care Management & Community Referrals:  No flowsheet data found.

## 2013-12-13 NOTE — Assessment & Plan Note (Signed)
Patient has a history of multiple pelvic surgeries for rectal and uterine prolapse. Patient states that she is to wear a pad due to her urge incontinence. She has tried Kegel exercises in the past without much help.  - Referral back to urology.

## 2013-12-13 NOTE — Progress Notes (Signed)
INTERNAL MEDICINE TEACHING ATTENDING ADDENDUM - Earl LagosNischal Jamell Laymon, MD: I personally saw and evaluated Ms. Stockert in this clinic visit in conjunction with the resident, Dr. Loma NewtonNgo. I have discussed patient's plan of care with medical resident during this visit. I have confirmed the physical exam findings and have read and agree with the clinic note including the plan with the following addition: - Pt will likely need rheumatology referral on next visit given persistent joint pains with negative work up thus far - Pt with possible psoriatic lesions over medial aspect of right foot. Will try fluocinonide cream for now - Pt to be referred back to uro for urge incontinence given history of prolapse s/p repair and no improvement with kiegel exercises - Pt educated about importance of diet and exercise. She expresses understanding

## 2013-12-13 NOTE — Assessment & Plan Note (Signed)
Referral to nutrition

## 2013-12-13 NOTE — Assessment & Plan Note (Signed)
Patient continues to report pain in multiple joints, including the joints of the hands, elbows, hips, and knees. She states that previous trials of naproxen has not really helped with the pain. She is unsure whether the tramadol helps since she usually goes to sleep after taking it, has taken minimal amounts of it.  - Refill tramadol 50 mg twice a day as needed.  - Referral to nutrition for options for losing weight.  - Plain film of the right hand remarkable for early degenerative change, left hand unremarkable for any abnormalities.  - As mentioned under the skin lesion problem, may consider the possibility of psoriatic arthritis.  - Consider rheumatology referral at the next visit should her symptoms not improve.

## 2013-12-13 NOTE — Assessment & Plan Note (Signed)
Patient states that she had a insect bite of unknown source around 3 months ago on her right foot. Since then, she has had pruritic dark brown spots that migrate around the lateral and medial aspects of her foot. Exam is remarkable for 1 cm diameter hyperpigmented lesions that are not raised. There is surrounding dryness of skin with mild scaling. Etiology is unclear. No skin lesions anywhere else on the body. This would be atypical for eczema, psoriasis, or tinea pedis. However, patient with history of multiple joint arthritis at a young age.  - Fluocinonide cream  - Reevaluate in 2 weeks. Consider further evaluating the possibility for psoriasis and psoriatic arthritis if symptoms do not improve.

## 2013-12-13 NOTE — Assessment & Plan Note (Signed)
Patient has had a colonoscopy earlier this year. Patient declines Tdap and flu vaccines today. She states that she has had bad reactions to the flu vaccines in the past.

## 2013-12-13 NOTE — Assessment & Plan Note (Signed)
Options for weight loss which could be exacerbating the knee pain are limited given the pain in patient's fear of water.  - Referral to nutritionist.  - Continue tramadol

## 2014-01-12 ENCOUNTER — Ambulatory Visit (INDEPENDENT_AMBULATORY_CARE_PROVIDER_SITE_OTHER): Payer: Medicare Other | Admitting: Internal Medicine

## 2014-01-12 ENCOUNTER — Encounter: Payer: Self-pay | Admitting: Internal Medicine

## 2014-01-12 ENCOUNTER — Ambulatory Visit: Payer: Medicare Other | Admitting: Dietician

## 2014-01-12 VITALS — BP 142/76 | HR 56 | Temp 97.8°F | Ht 66.0 in | Wt 307.9 lb

## 2014-01-12 DIAGNOSIS — L989 Disorder of the skin and subcutaneous tissue, unspecified: Secondary | ICD-10-CM

## 2014-01-12 DIAGNOSIS — Z6841 Body Mass Index (BMI) 40.0 and over, adult: Principal | ICD-10-CM

## 2014-01-12 DIAGNOSIS — M1990 Unspecified osteoarthritis, unspecified site: Secondary | ICD-10-CM | POA: Diagnosis not present

## 2014-01-12 NOTE — Progress Notes (Signed)
Patient ID: Cassidy [name], female   DOB: 1970/09/05, 43 y.o.   MRN: [id number] Case discussed with Dr. Zada GirtKazibwe soon after the resident saw the patient.  We reviewed the resident's history and exam and pertinent patient test results.  I agree with the assessment, diagnosis, and plan of care documented in the resident's note.

## 2014-01-12 NOTE — Patient Instructions (Signed)
General Instructions: Please continue with the cream We will refer your to a rheumatologist  Please follow up here in 1-2 months  Please bring your medicines with you each time you come to clinic.  Medicines may include prescription medications, over-the-counter medications, herbal remedies, eye drops, vitamins, or other pills.   Progress Toward Treatment Goals:  No flowsheet data found.  Self Care Goals & Plans:  No flowsheet data found.  No flowsheet data found.   Care Management & Community Referrals:  No flowsheet data found.

## 2014-01-12 NOTE — Progress Notes (Signed)
Patient rescheduled nutritional counseling for 02/2015.

## 2014-01-12 NOTE — Progress Notes (Signed)
Patient ID: Cassidy [name], female   DOB: [birth year], 43 y.o.   MRN: [id number]   Subjective:   HPI: Cassidy [name] is a 43 y.o. woman with a history of asthma, arthritis, obesity, urge incontinence (status post surgical procedures for rectal and uterine prolapse) who presents to clinic for a routine follow up for a rash and joint pain. Last office visit was 12/13/2013. Please see my assessment, and plan for the status of these 2 problems.  ROS: Constitutional: Denies fever, chills, diaphoresis, appetite change and fatigue.  Respiratory: Denies SOB, DOE, cough, chest tightness, and wheezing. Denies chest pain. CVS: No chest pain, palpitations and leg swelling.  GI: No abdominal pain, nausea, vomiting, bloody stools GU: No dysuria, frequency, hematuria, or flank pain.  MSK: Still complaining of arthralgias, however, she does not have joint swellings, or constitutional symptoms. Psych: No depression symptoms. No SI or SA.    Objective:  Physical Exam: Filed Vitals:   01/12/14 1015  BP: 142/76  Pulse: 56  Temp: 97.8 F (36.6 C)  TempSrc: Oral  Height: 5\' 6"  (1.676 m)  Weight: 307 lb 14.4 oz (139.663 kg)  SpO2: 100%   General: Well nourished. No acute distress.  HEENT: Normal oral mucosa. MMM.  Lungs: CTA bilaterally. Heart: RRR; no extra sounds or murmurs  Abdomen: Non-distended, normal bowel sounds, soft, nontender; no hepatosplenomegaly  Extremities: No pedal edema. I do not appreciate any joint swelling or tenderness. Rash on the right foot: Almost unnoticeable to me. A small area of scanty desquamation on the medial plantar aspect of the foot.  Neurologic: Normal EOM,  Alert and oriented x3. No obvious neurologic/cranial nerve deficits.  Assessment & Plan:  Discussed case with my attending in the clinic, Dr. Dalphine HandingBhardwaj. See problem based charting.

## 2014-01-12 NOTE — Assessment & Plan Note (Signed)
The rash on the right foot has significantly improved since using Lidex cream. During her last visit the rash was thought to be from psoriasis. Plan -I encouraged her to use the cream if the rash recurs. - I also encourage her to mention it during her rheumatologist since her joint pains, and this rash might be related.

## 2014-01-12 NOTE — Assessment & Plan Note (Signed)
She reports the tramadol is not helping much. Right hand X-ray obtained during her last visit revealed mild degenerative changes. Left x ray was normal. Plan -Referral to rheumatology. - may continue with Tramadol or OTC tylenol as needed.

## 2014-01-15 ENCOUNTER — Encounter: Payer: Self-pay | Admitting: Internal Medicine

## 2014-01-15 DIAGNOSIS — N3941 Urge incontinence: Secondary | ICD-10-CM | POA: Diagnosis not present

## 2014-01-15 DIAGNOSIS — R351 Nocturia: Secondary | ICD-10-CM | POA: Diagnosis not present

## 2014-01-31 ENCOUNTER — Ambulatory Visit (INDEPENDENT_AMBULATORY_CARE_PROVIDER_SITE_OTHER): Payer: Medicare Other | Admitting: Internal Medicine

## 2014-01-31 VITALS — BP 137/103 | HR 62 | Temp 98.2°F | Wt 308.1 lb

## 2014-01-31 DIAGNOSIS — L989 Disorder of the skin and subcutaneous tissue, unspecified: Secondary | ICD-10-CM | POA: Diagnosis not present

## 2014-01-31 NOTE — Progress Notes (Signed)
   Subjective:    Patient ID: Cassidy [name], female    DOB: October 18, 1970, 43 y.o.   MRN: [id number]  HPI  Patient is a 43 year old woman with a history of asthma, arthritis, obesity, urge incontinence (status post surgical procedures for rectal and uterine prolapse) who presents to clinic for follow up of rash. Last clinic visit was on 01/12/2014 for rash on her right foot treated with Lidex cream and arthritis which she was continued on tramadol for.  Please refer to separate problem-list charting for more details.  Review of Systems  Constitutional: Negative for fever and chills.  HENT: Negative for rhinorrhea and sore throat.   Eyes: Negative for visual disturbance.  Respiratory: Negative for cough and shortness of breath.   Cardiovascular: Negative for chest pain and palpitations.  Gastrointestinal: Negative for nausea, vomiting, abdominal pain, diarrhea, constipation and blood in stool.  Genitourinary: Negative for dysuria and hematuria.  Neurological: Negative for syncope.      Objective:   Physical Exam  Constitutional: She is oriented to person, place, and time. She appears well-developed and well-nourished. No distress.  HENT:  Head: Normocephalic and atraumatic.  Eyes: EOM are normal. Pupils are equal, round, and reactive to light. Left eye exhibits no discharge.  Neck: Normal range of motion. Neck supple. No thyromegaly present.  Cardiovascular: Normal rate and regular rhythm.  Exam reveals no gallop and no friction rub.   No murmur heard. Pulmonary/Chest: Effort normal and breath sounds normal. No respiratory distress. She has no wheezes. She has no rales.  Abdominal: Soft. Bowel sounds are normal. She exhibits no distension. There is no tenderness. There is no rebound.  Musculoskeletal: She exhibits no edema.  Neurological: She is alert and oriented to person, place, and time. No cranial nerve deficit.  Skin: Skin is warm and dry. Rash ( hyperpigmented macules that are  ovoid in shape and around 3 cm in diameter with variable scaling located in right foot, left inner thigh, central chest, and peri-umblical region) noted.  Psychiatric: She has a normal mood and affect. Thought content normal.      Assessment & Plan:  Please refer to separate problem-list charting for more details.

## 2014-01-31 NOTE — Progress Notes (Signed)
Internal Medicine Clinic Attending  I saw and evaluated the patient.  I personally confirmed the key portions of the history and exam documented by Dr. Ngo and I reviewed pertinent patient test results.  The assessment, diagnosis, and plan were formulated together and I agree with the documentation in the resident's note. 

## 2014-01-31 NOTE — Assessment & Plan Note (Addendum)
Patient reporting that the pruritic rash that she has had on her foot has responded well to Lidex cream. Patient also reports that she has had other areas of involvement throughout her body, some of which have been around for years, and others that have just emerged within the last couple weeks. The areas of involvement include the right foot, left inner thigh, central chest overlying the sternum, and umbilical region. Patient states that there is no association between the eruption of these rashes and seasonality. She denies any family history of psoriasis or other autoimmune disorders. Patient states that she has been applying the Lidex cream to the other areas of involvement with some alleviation of her pruritus. There is variable scaling with these lesions. Differential includes psoriasis, atopic dermatitis, tinea corporis. There does not seem to be an annular pattern of scaling to suggest tinea corporis, and there is no significant scaling upon presentation here in clinic for obtaining KOH prep. Additionally, patient has had some response to steroid cream, although there are new areas of eruption throughout her body.  - continue with Lidex cream. Counseled patient on avoiding areas within skin such as her face and eyelids.  - dermatology referral should her symptoms not improve.

## 2014-01-31 NOTE — Patient Instructions (Addendum)
Please continue using your fluocinonide cream on the lesions throughout your body. Please avoid applying the cream to any areas of the body that have thin skin, including your eyelids and face. We have put you in for a referral to dermatology, should these lesions not resolve in the you can get further evaluation for them.  General Instructions:   Please bring your medicines with you each time you come to clinic.  Medicines may include prescription medications, over-the-counter medications, herbal remedies, eye drops, vitamins, or other pills.

## 2014-02-15 ENCOUNTER — Other Ambulatory Visit: Payer: Self-pay | Admitting: Internal Medicine

## 2014-02-15 ENCOUNTER — Encounter: Payer: Self-pay | Admitting: Dietician

## 2014-02-15 ENCOUNTER — Ambulatory Visit (INDEPENDENT_AMBULATORY_CARE_PROVIDER_SITE_OTHER): Payer: Medicare Other | Admitting: Dietician

## 2014-02-15 ENCOUNTER — Other Ambulatory Visit: Payer: Self-pay | Admitting: Obstetrics & Gynecology

## 2014-02-15 VITALS — Wt 310.9 lb

## 2014-02-15 DIAGNOSIS — R921 Mammographic calcification found on diagnostic imaging of breast: Secondary | ICD-10-CM

## 2014-02-15 DIAGNOSIS — Z713 Dietary counseling and surveillance: Secondary | ICD-10-CM | POA: Diagnosis not present

## 2014-02-15 DIAGNOSIS — Z6841 Body Mass Index (BMI) 40.0 and over, adult: Secondary | ICD-10-CM | POA: Diagnosis not present

## 2014-02-15 NOTE — Progress Notes (Addendum)
Health Coaching for Weight Loss Session # 2- last visit was 05/2013 start time:  825 AM       end time: 908 AM 1- Assessment:  Behavioral Health risk(s): has cut out soda completely and drinks ~ 32 oz juice or kookaid/day. 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 Reports her activity is limited by pain and cold weather.  Current BMI=50, weight 310.9#   Reasonable body weight= 220-230#  Estimated needs for weight loss= 2500-3000 calories/day  Self confidence regarding weight loss assessed and poor to moderate today 2- Advise:  Behavior change advise:record daily intake.   Health harms and benefits:  Weight loss may help pain, depressive symptoms 3- Agreement on Goals:  For next week:atient to download My[employer] PAL before next visit  For next 6 months:  Not set yet, consider < 300 #  In next year: < 280#   Long term: Not discussed today 4- Assist: assisted patient in setting SMART goal for her desired weight loss.   5- Arrange follow up: 1 week

## 2014-02-22 ENCOUNTER — Encounter: Payer: Medicare Other | Admitting: Dietician

## 2014-02-27 ENCOUNTER — Encounter: Payer: Medicare Other | Admitting: Dietician

## 2014-03-20 ENCOUNTER — Encounter: Payer: Self-pay | Admitting: Dietician

## 2014-03-20 ENCOUNTER — Ambulatory Visit (INDEPENDENT_AMBULATORY_CARE_PROVIDER_SITE_OTHER): Payer: Medicare Other | Admitting: Dietician

## 2014-03-20 DIAGNOSIS — Z7189 Other specified counseling: Secondary | ICD-10-CM | POA: Diagnosis not present

## 2014-03-20 NOTE — Progress Notes (Addendum)
Health Coaching for Weight Loss Session # 3- last visit was 02/15/2014 start time:  945 AM       end time: 1025 AM 1- Assessment:  Behavioral Health risk(s): Increased weight puts more stress on joints, makes activity harder and more uncomfortable.  Factors affecting behavior change goals: is consumed with planning her wedding. Wants to fit in her dress. Reports her activity has increased in last month.  Current BMI=49.4, weight 305.5#   Reasonable body weight= 220-230#  Estimated needs for weight loss= 1800-2200 calories/day  Self confidence regarding weight loss assessed and has increased to a "4" out of 5. 2- Advise:  Behavior change advise:record daily intake, how to use My fitness pal; to do this.   Health harms and benefits:  Weight loss may help pain, depressive symptoms 3- Agreement on Goals:  For next week:patient to track meals 14-21 times using [employer] PAL  For next 1 months:  7.5#, consider < 300 #  In next year: < 280#   Long term:220# 4- Assist: assisted patient in setting SMART goal for her desired weight loss.   5- Arrange follow up: 1 week

## 2014-04-03 ENCOUNTER — Inpatient Hospital Stay: Admission: RE | Admit: 2014-04-03 | Payer: Medicare Other | Source: Ambulatory Visit

## 2014-04-09 ENCOUNTER — Encounter: Payer: Self-pay | Admitting: Internal Medicine

## 2014-04-09 ENCOUNTER — Encounter: Payer: Medicare Other | Admitting: Internal Medicine

## 2014-04-18 ENCOUNTER — Ambulatory Visit
Admission: RE | Admit: 2014-04-18 | Discharge: 2014-04-18 | Disposition: A | Discharge status: Home / Self Care | Payer: Medicare Other | Source: Ambulatory Visit | Attending: Internal Medicine | Admitting: Internal Medicine

## 2014-04-18 DIAGNOSIS — R921 Mammographic calcification found on diagnostic imaging of breast: Secondary | ICD-10-CM | POA: Diagnosis not present

## 2014-04-27 NOTE — Addendum Note (Signed)
Addended by: Neomia DearPOWERS, Tashunda Vandezande E on: 04/27/2014 06:05 PM   Modules accepted: Orders

## 2014-05-02 ENCOUNTER — Ambulatory Visit (INDEPENDENT_AMBULATORY_CARE_PROVIDER_SITE_OTHER): Payer: Medicare Other | Admitting: Internal Medicine

## 2014-05-02 ENCOUNTER — Encounter: Payer: Self-pay | Admitting: Internal Medicine

## 2014-05-02 VITALS — BP 140/60 | HR 69 | Temp 98.7°F | Ht 66.0 in | Wt 306.1 lb

## 2014-05-02 DIAGNOSIS — Z6841 Body Mass Index (BMI) 40.0 and over, adult: Secondary | ICD-10-CM | POA: Diagnosis not present

## 2014-05-02 DIAGNOSIS — M25561 Pain in right knee: Secondary | ICD-10-CM | POA: Diagnosis not present

## 2014-05-02 DIAGNOSIS — M25562 Pain in left knee: Secondary | ICD-10-CM | POA: Diagnosis not present

## 2014-05-02 MED ORDER — MELOXICAM 7.5 MG PO TABS: 7.5000 mg | ORAL_TABLET | Freq: Every day | ORAL | Status: DC

## 2014-05-02 NOTE — Progress Notes (Signed)
   Subjective:    Patient ID: Cassidy [name], female    DOB: Jan 03, 1971, 44 y.o.   MRN: [id number]  HPI  Patient is a 44 year old with a history of chronic bilateral knee pain, asthma, obesity who presents to clinic for persistent knee pain.  Please refer to separate problem-list charting for more details.  Review of Systems  Constitutional: Negative for fever and chills.  HENT: Negative for rhinorrhea and sore throat.   Eyes: Negative for visual disturbance.  Respiratory: Negative for cough and shortness of breath.   Cardiovascular: Negative for chest pain and palpitations.  Gastrointestinal: Negative for nausea, vomiting, abdominal pain, diarrhea, constipation and blood in stool.  Genitourinary: Negative for dysuria and hematuria.  Musculoskeletal:       Knee pain, elbow pain, foot pain  Neurological: Negative for syncope.       Objective:   Physical Exam  Constitutional: She is oriented to person, place, and time. She appears well-developed and well-nourished. No distress.  HENT:  Head: Normocephalic and atraumatic.  Eyes: EOM are normal. Pupils are equal, round, and reactive to light. Left eye exhibits no discharge.  Neck: Normal range of motion. Neck supple. No thyromegaly present.  Cardiovascular: Normal rate and regular rhythm.  Exam reveals no gallop and no friction rub.   No murmur heard. Pulmonary/Chest: Effort normal and breath sounds normal. No respiratory distress. She has no wheezes. She has no rales.  Abdominal: Soft. Bowel sounds are normal. She exhibits no distension. There is no tenderness. There is no rebound.  Musculoskeletal: She exhibits tenderness ( Mild tenderness to palpation throughout bilateral knee joints, elbow joints, and feet. No evidence of effusion or warmth at any of these joints. Normal range of motion to passive and active motion). She exhibits no edema.  Neurological: She is alert and oriented to person, place, and time. No cranial nerve deficit.   Skin: Skin is warm and dry. No rash noted.  Psychiatric: She has a normal mood and affect. Thought content normal.      Assessment & Plan:  Please refer to separate problem-list charting for more details.

## 2014-05-02 NOTE — Patient Instructions (Signed)
I'm sorry to hear that your joint pain has continued to not improve. I prescribed a new medication called meloxicam to which we have seen a lot of people respond well. Please let us know in 3-4 weeks if your pain is not improving as we can try different medications at that point. Please continue your best to improve your diet and exercise as this can also help to improve your joint pain.  General Instructions:   Please bring your medicines with you each time you come to clinic.  Medicines may include prescription medications, over-the-counter medications, herbal remedies, eye drops, vitamins, or other pills.   Progress Toward Treatment Goals:  No flowsheet data found.  Self Care Goals & Plans:  No flowsheet data found.  No flowsheet data found.   Care Management & Community Referrals:  No flowsheet data found.

## 2014-05-02 NOTE — Assessment & Plan Note (Signed)
Counseled patient on the importance of weight loss and how her current symptoms of pain secondary to osteoarthritis or complicated by her weight. Patient states that she is willing to continue pursuing diet and exercise modifications for control of her weight.

## 2014-05-02 NOTE — Assessment & Plan Note (Signed)
Patient states that she has continued to have bilateral knee pain which has not responded to medications and has also been associated with pain in her elbow and feet as well. She reports that her symptoms are greater on the right than the left. Patient does have a history of pruritic skin lesions on the medial aspect of her foot but these rashes have resolved. Patient has a history remarkable for a negative rheumatoid arthritis workup including a negative ESR, rheumatoid factor, CCP, and ANA in 2014. Patient has recent knee x-rays only remarkable for early degenerative change bilaterally. Patient states that her symptoms are not worse in the morning. She states that they are exacerbated especially when she goes upstairs. Patient otherwise denies any systemic symptoms. Her exam is unremarkable for any warmth at any joints. It is likely that her presentation represents exacerbation of her osteoarthritis symptoms at multiple joints. -Trial of meloxicam 7.5 mg daily -Patient instructed to return to clinic should her symptoms not improve on the new medication -Would consider trial of diclofenac or etodolac at next visit should her symptoms not improve. -Encouraged patient to continue to pursue exercise and diet in an attempt for weight loss.

## 2014-05-03 NOTE — Progress Notes (Signed)
Case discussed with Dr. Ngo at time of visit.  We reviewed the resident's history and exam and pertinent patient test results.  I agree with the assessment, diagnosis, and plan of care documented in the resident's note. 

## 2014-05-30 ENCOUNTER — Telehealth: Payer: Self-pay | Admitting: *Deleted

## 2014-05-30 NOTE — Addendum Note (Signed)
Addended by: Dow AdolphKAZIBWE, Taray Normoyle on: 05/30/2014 08:23 PM   Modules accepted: Orders

## 2014-06-16 ENCOUNTER — Emergency Department (HOSPITAL_COMMUNITY)
Admission: EM | Admit: 2014-06-16 | Discharge: 2014-06-16 | Disposition: A | Discharge status: Home / Self Care | Payer: Medicare Other | Attending: Emergency Medicine | Admitting: Emergency Medicine

## 2014-06-16 ENCOUNTER — Encounter (HOSPITAL_COMMUNITY): Payer: Self-pay | Admitting: *Deleted

## 2014-06-16 DIAGNOSIS — M1990 Unspecified osteoarthritis, unspecified site: Secondary | ICD-10-CM | POA: Diagnosis not present

## 2014-06-16 DIAGNOSIS — Z79899 Other long term (current) drug therapy: Secondary | ICD-10-CM | POA: Insufficient documentation

## 2014-06-16 DIAGNOSIS — Z792 Long term (current) use of antibiotics: Secondary | ICD-10-CM | POA: Insufficient documentation

## 2014-06-16 DIAGNOSIS — R51 Headache: Secondary | ICD-10-CM | POA: Diagnosis not present

## 2014-06-16 DIAGNOSIS — R6889 Other general symptoms and signs: Secondary | ICD-10-CM

## 2014-06-16 DIAGNOSIS — R05 Cough: Secondary | ICD-10-CM | POA: Diagnosis present

## 2014-06-16 DIAGNOSIS — N390 Urinary tract infection, site not specified: Secondary | ICD-10-CM | POA: Insufficient documentation

## 2014-06-16 DIAGNOSIS — Z791 Long term (current) use of non-steroidal anti-inflammatories (NSAID): Secondary | ICD-10-CM | POA: Insufficient documentation

## 2014-06-16 DIAGNOSIS — J45909 Unspecified asthma, uncomplicated: Secondary | ICD-10-CM | POA: Insufficient documentation

## 2014-06-16 DIAGNOSIS — J111 Influenza due to unidentified influenza virus with other respiratory manifestations: Secondary | ICD-10-CM | POA: Diagnosis not present

## 2014-06-16 DIAGNOSIS — Z3202 Encounter for pregnancy test, result negative: Secondary | ICD-10-CM | POA: Diagnosis not present

## 2014-06-16 LAB — URINALYSIS, ROUTINE W REFLEX MICROSCOPIC
BILIRUBIN URINE: NEGATIVE
GLUCOSE, UA: NEGATIVE mg/dL
KETONES UR: NEGATIVE mg/dL
NITRITE: NEGATIVE
Protein, ur: NEGATIVE mg/dL
Specific Gravity, Urine: 1.019 (ref 1.005–1.030)
UROBILINOGEN UA: 0.2 mg/dL (ref 0.0–1.0)
pH: 6.5 (ref 5.0–8.0)

## 2014-06-16 LAB — URINE MICROSCOPIC-ADD ON

## 2014-06-16 LAB — RAPID STREP SCREEN (MED CTR MEBANE ONLY): Streptococcus, Group A Screen (Direct): NEGATIVE

## 2014-06-16 LAB — POC URINE PREG, ED: PREG TEST UR: NEGATIVE

## 2014-06-16 MED ORDER — CEPHALEXIN 500 MG PO CAPS: 500.0000 mg | ORAL_CAPSULE | Freq: Two times a day (BID) | ORAL | Status: DC

## 2014-06-16 MED ORDER — HYDROCODONE-HOMATROPINE 5-1.5 MG/5ML PO SYRP: 5.0000 mL | ORAL_SOLUTION | Freq: Four times a day (QID) | ORAL | Status: DC | PRN

## 2014-06-16 MED ORDER — LIDOCAINE VISCOUS 2 % MT SOLN
15.0000 mL | Freq: Once | OROMUCOSAL | Status: AC
  Administered 2014-06-16: 15 mL via OROMUCOSAL
  Filled 2014-06-16: qty 15

## 2014-06-16 NOTE — Discharge Instructions (Signed)
Please follow up with your primary care physician in 1-2 days. If you do not have one please call the [facility] number listed above. Please alternate between Motrin and Tylenol every three hours for fevers and pain. Please take your antibiotic until completion. Please take pain medication and/or muscle relaxants as prescribed and as needed for pain. Please do not drive on narcotic pain medication or on muscle relaxants. Please read all discharge instructions and return precautions.   Influenza Influenza ("the flu") is a viral infection of the respiratory tract. It occurs more often in winter months because people spend more time in close contact with one another. Influenza can make you feel very sick. Influenza easily spreads from person to person (contagious). CAUSES  Influenza is caused by a virus that infects the respiratory tract. You can catch the virus by breathing in droplets from an infected person's cough or sneeze. You can also catch the virus by touching something that was recently contaminated with the virus and then touching your mouth, nose, or eyes. RISKS AND COMPLICATIONS You may be at risk for a more severe case of influenza if you smoke cigarettes, have diabetes, have chronic heart disease (such as heart failure) or lung disease (such as asthma), or if you have a weakened immune system. Elderly people and pregnant women are also at risk for more serious infections. The most common problem of influenza is a lung infection (pneumonia). Sometimes, this problem can require emergency medical care and may be life threatening. SIGNS AND SYMPTOMS  Symptoms typically last 4 to 10 days and may include:  Fever.  Chills.  Headache, body aches, and muscle aches.  Sore throat.  Chest discomfort and cough.  Poor appetite.  Weakness or feeling tired.  Dizziness.  Nausea or vomiting. DIAGNOSIS  Diagnosis of influenza is often made based on your history and a  physical exam. A nose or throat swab test can be done to confirm the diagnosis. TREATMENT  In mild cases, influenza goes away on its own. Treatment is directed at relieving symptoms. For more severe cases, your health care provider may prescribe antiviral medicines to shorten the sickness. Antibiotic medicines are not effective because the infection is caused by a virus, not by bacteria. HOME CARE INSTRUCTIONS  Take medicines only as directed by your health care provider.  Use a cool mist humidifier to make breathing easier.  Get plenty of rest until your temperature returns to normal. This usually takes 3 to 4 days.  Drink enough fluid to keep your urine clear or pale yellow.  Cover yourmouth and nosewhen coughing or sneezing,and wash your handswellto prevent thevirusfrom spreading.  Stay homefromwork orschool untilthe fever is gonefor at least 111full day. PREVENTION  An annual influenza vaccination (flu shot) is the best way to avoid getting influenza. An annual flu shot is now routinely recommended for all adults in the [place] SEEK MEDICAL CARE IF:  You experiencechest pain, yourcough worsens,or you producemore mucus.  Youhave nausea,vomiting, ordiarrhea.  Your fever returns or gets worse. SEEK IMMEDIATE MEDICAL CARE IF:  You havetrouble breathing, you become short of breath,or your skin ornails becomebluish.  You have severe painor stiffnessin the neck.  You develop a sudden headache, or pain in the face or ear.  You have nausea or vomiting that you cannot control. MAKE SURE YOU:   Understand these instructions.  Will watch your condition.  Will get help right away if you are not doing well or get worse. Document Released:  02/28/2000 Document Revised: 07/17/2013 Document Reviewed: 06/01/2011 Exit[employer] Patient Information 2015 [place], [place]. This information is not intended to replace advice given to you by your health care provider. Make sure you  discuss any questions you have with your health care provider.  Urinary Tract Infection Urinary tract infections (UTIs) can develop anywhere along your urinary tract. Your urinary tract is your body's drainage system for removing wastes and extra water. Your urinary tract includes two kidneys, two ureters, a bladder, and a urethra. Your kidneys are a pair of bean-shaped organs. Each kidney is about the size of your fist. They are located below your ribs, one on each side of your spine. CAUSES Infections are caused by microbes, which are microscopic organisms, including fungi, viruses, and bacteria. These organisms are so small that they can only be seen through a microscope. Bacteria are the microbes that most commonly cause UTIs. SYMPTOMS  Symptoms of UTIs may vary by age and gender of the patient and by the location of the infection. Symptoms in young women typically include a frequent and intense urge to urinate and a painful, burning feeling in the bladder or urethra during urination. Older women and men are more likely to be tired, shaky, and weak and have muscle aches and abdominal pain. A fever may mean the infection is in your kidneys. Other symptoms of a kidney infection include pain in your back or sides below the ribs, nausea, and vomiting. DIAGNOSIS To diagnose a UTI, your caregiver will ask you about your symptoms. Your caregiver also will ask to provide a urine sample. The urine sample will be tested for bacteria and white blood cells. White blood cells are made by your body to help fight infection. TREATMENT  Typically, UTIs can be treated with medication. Because most UTIs are caused by a bacterial infection, they usually can be treated with the use of antibiotics. The choice of antibiotic and length of treatment depend on your symptoms and the type of bacteria causing your infection. HOME CARE INSTRUCTIONS  If you were prescribed antibiotics, take them exactly as your caregiver  instructs you. Finish the medication even if you feel better after you have only taken some of the medication.  Drink enough water and fluids to keep your urine clear or pale yellow.  Avoid caffeine, tea, and carbonated beverages. They tend to irritate your bladder.  Empty your bladder often. Avoid holding urine for long periods of time.  Empty your bladder before and after sexual intercourse.  After a bowel movement, women should cleanse from front to back. Use each tissue only once. SEEK MEDICAL CARE IF:   You have back pain.  You develop a fever.  Your symptoms do not begin to resolve within 3 days. SEEK IMMEDIATE MEDICAL CARE IF:   You have severe back pain or lower abdominal pain.  You develop chills.  You have nausea or vomiting.  You have continued burning or discomfort with urination. MAKE SURE YOU:   Understand these instructions.  Will watch your condition.  Will get help right away if you are not doing well or get worse. Document Released: 12/10/2004 Document Revised: 09/01/2011 Document Reviewed: 04/10/2011 [facility] Patient Information 2015 [place], [place]. This information is not intended to replace advice given to you by your health care provider. Make sure you discuss any questions you have with your health care provider.

## 2014-06-16 NOTE — ED Provider Notes (Signed)
CSN: [id number]     Arrival date & time 06/16/14  0104 History   First MD Initiated Contact with Patient 06/16/14 [phone]     Chief Complaint  Patient presents with  . multiple complaints      (Consider location/radiation/quality/duration/timing/severity/associated sxs/prior Treatment) HPI Comments: Patient is a 44 yo F presenting to the ED for evaluation of cough, nasal congestion, rhinorrhea, generalized headache, myalgias, arthralgias, suprapubic discomfort, subjective fevers and chills that began approximately 4 days ago. She states her symptoms began gradually worsening. She has tried DayQuil with minimal improvement. No modifying factors identified. No other medications attempted. No known sick contacts. Denies any nausea, vomiting, diarrhea, abdominal pain, urinary symptoms, vaginal discharge or bleeding. Denies any new sexual partners or concern for STDs.    Past Medical History  Diagnosis Date  . Asthma   . Arthritis    Past Surgical History  Procedure Laterality Date  . Cholecystectomy  1998  . Tubal ligation Bilateral 2005  . Rectal prolapse repair, rectopexy  2007    Uterine and rectal prolapse repair.   Family History  Problem Relation Age of Onset  . Asthma Sister   . Asthma Brother   . Asthma Maternal Grandmother   . Asthma Maternal Grandfather   . Prostate cancer Brother   . Colon cancer Brother [id number]    Diet about 10 years before. Was diagnosed in late 30s.  . Throat cancer Paternal Uncle    History  Substance Use Topics  . Smoking status: Never Smoker   . Smokeless tobacco: Never Used  . Alcohol Use: No   OB History    Gravida Para Term Preterm AB TAB SAB Ectopic Multiple Living   7 5 4 1 2 1 1   5      Review of Systems  Constitutional: Positive for fever.  HENT: Positive for congestion, rhinorrhea and sinus pressure.   Respiratory: Positive for cough.   Gastrointestinal: Negative for nausea, vomiting, abdominal pain and diarrhea.  Musculoskeletal:  Positive for myalgias and arthralgias. Negative for neck pain.  Skin: Negative for rash.  Neurological: Positive for headaches. Negative for syncope, weakness, light-headedness and numbness.  All other systems reviewed and are negative.     Allergies  Review of patient's allergies indicates no known allergies.  Home Medications   Prior to Admission medications   Medication Sig Start Date End Date Taking? Authorizing Provider  albuterol (PROVENTIL HFA;VENTOLIN HFA) 108 (90 BASE) MCG/ACT inhaler Inhale 2 puffs into the lungs every 6 (six) hours as needed for wheezing or shortness of breath. 08/03/13   Ejiroghene E Emokpae, MD  cephALEXin (KEFLEX) 500 MG capsule Take 1 capsule (500 mg total) by mouth 2 (two) times daily. 06/16/14   Alvia Tory, PA-C  ferrous sulfate 325 (65 FE) MG tablet Take 1 tablet (325 mg total) by mouth 3 (three) times daily with meals. 12/13/13 12/13/14  Harold BarbanLawrence Ngo, MD  fluocinonide cream (LIDEX) 0.05 % Apply 1 application topically 2 (two) times daily. 12/13/13   Harold BarbanLawrence Ngo, MD  Fluticasone-Salmeterol (ADVAIR) 250-50 MCG/DOSE AEPB Inhale 1 puff into the lungs 2 (two) times daily. 08/03/13   Ejiroghene Wendall StadeE Emokpae, MD  HYDROcodone-homatropine (HYCODAN) 5-1.5 MG/5ML syrup Take 5 mLs by mouth every 6 (six) hours as needed for cough. 06/16/14   Alonnah Lampkins, PA-C  meloxicam (MOBIC) 7.5 MG tablet Take 1 tablet (7.5 mg total) by mouth daily. 05/02/14 05/02/15  Harold BarbanLawrence Ngo, MD  TraMADol HCl 50 MG TBDP Take 50 mg by mouth 2 (two) times  daily. 12/13/13   Harold Barban, MD   BP 108/57 mmHg  Pulse 66  Temp(Src) 98.9 F (37.2 C) (Oral)  Resp 20  Ht  (1.676 m)  Wt 308 lb (139.708 kg)  BMI 49.74 kg/m2  SpO2 100%  LMP 06/11/2014 Physical Exam  Constitutional: She is oriented to person, place, and time. She appears well-developed and well-nourished. No distress.  HENT:  Head: Normocephalic and atraumatic.  Right Ear: External ear normal.  Left Ear: External ear  normal.  Nose: Nose normal.  Mouth/Throat: Uvula is midline and mucous membranes are normal. Posterior oropharyngeal erythema present. No oropharyngeal exudate, posterior oropharyngeal edema or tonsillar abscesses.  Eyes: Conjunctivae are normal.  Neck: Normal range of motion. Neck supple.  No nuchal rigidity.   Cardiovascular: Normal rate, regular rhythm and normal heart sounds.   Pulmonary/Chest: Effort normal and breath sounds normal. No respiratory distress.  Abdominal: Soft. There is no tenderness.  Musculoskeletal: Normal range of motion.  Lymphadenopathy:    She has cervical adenopathy.  Neurological: She is alert and oriented to person, place, and time.  Skin: Skin is warm and dry. She is not diaphoretic.  Psychiatric: She has a normal mood and affect.  Nursing note and vitals reviewed.   ED Course  Procedures (including critical care time) Medications  lidocaine (XYLOCAINE) 2 % viscous mouth solution 15 mL (15 mLs Mouth/Throat Given 06/16/14 0515)    Labs Review Labs Reviewed  URINALYSIS, ROUTINE W REFLEX MICROSCOPIC - Abnormal; Notable for the following:    APPearance CLOUDY (*)    Hgb urine dipstick MODERATE (*)    Leukocytes, UA MODERATE (*)    All other components within normal limits  URINE MICROSCOPIC-ADD ON - Abnormal; Notable for the following:    Squamous Epithelial / LPF FEW (*)    Bacteria, UA MANY (*)    All other components within normal limits  RAPID STREP SCREEN  CULTURE, GROUP A STREP  POC URINE PREG, ED    Imaging Review No results found.   EKG Interpretation None      MDM   Final diagnoses:  Flu-like symptoms  UTI (lower urinary tract infection)    Filed Vitals:   06/16/14 0552  BP: 108/57  Pulse: 66  Temp: 98.9 F (37.2 C)  Resp: 20   Afebrile, NAD, non-toxic appearing, AAOx4.   1) Patient with symptoms consistent with influenza.  Vitals are stable, low-grade fever.  No signs of dehydration, tolerating PO's.  Lungs are clear.  Due to patient's presentation and physical exam a chest x-ray was not ordered bc likely diagnosis of flu.  Discussed the cost versus benefit of Tamiflu treatment with the patient.  The patient understands that symptoms are greater than the recommended 24-48 hour window of treatment.  Patient will be discharged with instructions to orally hydrate, rest, and use over-the-counter medications such as anti-inflammatories ibuprofen and Aleve for muscle aches and Tylenol for fever.  Patient will also be given a cough suppressant.    2) UTI: Pt has been diagnosed with a UTI. Pt is afebrile, no CVA tenderness, normotensive, and denies N/V. Pt to be dc home with antibiotics and instructions to follow up with PCP if symptoms persist.  Patient is stable at time of discharge    Francee Piccolo, PA-C 06/16/14 8119  Shon Baton, MD 06/16/14 [phone]

## 2014-06-16 NOTE — ED Notes (Signed)
Preparing discharge, called Candise BowensJen in regards to urine results. She acknowledges, awaiting new orders.

## 2014-06-16 NOTE — ED Notes (Signed)
The pt is c/o a cold cough headache sore throat  Temp and general body aches since last pm.  lmp monday

## 2014-06-18 LAB — CULTURE, GROUP A STREP: Strep A Culture: NEGATIVE

## 2014-06-25 ENCOUNTER — Encounter: Payer: Medicare Other | Admitting: Internal Medicine

## 2014-07-05 ENCOUNTER — Encounter: Payer: Self-pay | Admitting: Internal Medicine

## 2014-07-05 ENCOUNTER — Ambulatory Visit (INDEPENDENT_AMBULATORY_CARE_PROVIDER_SITE_OTHER): Payer: Medicare Other | Admitting: Internal Medicine

## 2014-07-05 VITALS — BP 108/48 | HR 67 | Temp 98.4°F | Ht 66.0 in | Wt 308.8 lb

## 2014-07-05 DIAGNOSIS — R109 Unspecified abdominal pain: Secondary | ICD-10-CM | POA: Diagnosis not present

## 2014-07-05 DIAGNOSIS — M2550 Pain in unspecified joint: Secondary | ICD-10-CM | POA: Diagnosis not present

## 2014-07-05 DIAGNOSIS — M79643 Pain in unspecified hand: Secondary | ICD-10-CM | POA: Diagnosis not present

## 2014-07-05 DIAGNOSIS — M25562 Pain in left knee: Secondary | ICD-10-CM | POA: Diagnosis not present

## 2014-07-05 DIAGNOSIS — M150 Primary generalized (osteo)arthritis: Secondary | ICD-10-CM | POA: Diagnosis not present

## 2014-07-05 DIAGNOSIS — M25561 Pain in right knee: Secondary | ICD-10-CM | POA: Diagnosis not present

## 2014-07-05 LAB — POCT URINE PREGNANCY: Preg Test, Ur: NEGATIVE

## 2014-07-05 NOTE — Progress Notes (Signed)
   Subjective:    Patient ID: Cassidy [name], female    DOB: 02/23/1971, 44 y.o.   MRN: [id number]  HPI  Patient is a 44 year old female with a history of morbid obesity, bilateral knee pain, arthritis who presents to clinic for a routine follow up.   Please see problem based charting for more details.  Review of Systems  Constitutional: Negative for fever and chills.  HENT: Negative for rhinorrhea and sore throat.   Eyes: Negative for visual disturbance.  Respiratory: Negative for cough and shortness of breath.   Cardiovascular: Negative for chest pain and palpitations.  Gastrointestinal: Negative for nausea, vomiting, abdominal pain, diarrhea, constipation and blood in stool.       Abdominal cramping  Genitourinary: Negative for dysuria and hematuria.  Neurological: Negative for syncope.       Objective:   Physical Exam  Constitutional: She is oriented to person, place, and time. She appears well-developed and well-nourished. No distress.  HENT:  Head: Normocephalic and atraumatic.  Eyes: EOM are normal. Pupils are equal, round, and reactive to light.  Neck: Normal range of motion. Neck supple. No thyromegaly present.  Cardiovascular: Normal rate and regular rhythm.  Exam reveals no gallop and no friction rub.   No murmur heard. Pulmonary/Chest: Effort normal and breath sounds normal. No respiratory distress. She has no wheezes. She has no rales.  Abdominal: Soft. Bowel sounds are normal. She exhibits no distension. There is no tenderness. There is no rebound.  Musculoskeletal: She exhibits no edema.  Neurological: She is alert and oriented to person, place, and time. No cranial nerve deficit.  Skin: Skin is warm and dry. No rash noted.  Psychiatric: She has a normal mood and affect. Thought content normal.          Assessment & Plan:  Please see problem based charting for more details.

## 2014-07-05 NOTE — Assessment & Plan Note (Signed)
Patient states that she has had lower abdominal cramping since her evaluation in the emergency department earlier this month. At that time, she was found to have bacteria and was treated with a course of cephalexin which she has completed. At that time, patient did not report any symptoms of dysuria, hematuria, suprapubic pressure, frequency, hesitancy, or urgency. Patient states that her abdominal cramping has continued even after treatment with antibiotics. She is coming to clinic today wondering if she may have a persistent urinary tract infection. Today, patient also denies any associated urinary symptoms. It is low likelihood that patient has symptoms caused by urinary tract infection. Patient states that she is sexually active although she is monogamous with a partner and has been for 15 years. Patient denies any vaginal discharge or cutaneous symptoms to suggest a sexually transmitted infection. Even though patient is status post tubal ligation, it is worth working her up for possible tubal pregnancy that could be causing her symptoms. Another consideration could be endometriosis although patient does not report any association of her pain with her cycles. -Point-of-care urine pregnancy test negative. -Trial patient with meloxicam as described under the knee pain problem. -May consider referral to OB/GYN if patient does not respond well to the meloxicam.

## 2014-07-05 NOTE — Assessment & Plan Note (Signed)
Patient was given a trial of meloxicam last time although was not able to fill this prescription because of insurance issues. She states that these issues will be resolved next month. Patient states that she saw a rheumatologist recently who concluded that she does not have a rheumatological disorder and send a prescription for meloxicam for her knee pain. -Patient to trial meloxicam 7.5 mg daily. -Consider trial of diclofenac or etodolac at next visit should her symptoms not improve. -Encourage patient to pursue exercise diet in attempt for weight loss.

## 2014-07-05 NOTE — Patient Instructions (Signed)
Please try to take your meloxicam (Mobic) to see whether it helps with your knee pain and with your abdominal cramping. I do not believe that your cramping is caused by a urinary tract infection. If you do develop any symptoms of burning with urination or noticed any blood in your urine, please come back to clinic for a test.  General Instructions:   Please bring your medicines with you each time you come to clinic.  Medicines may include prescription medications, over-the-counter medications, herbal remedies, eye drops, vitamins, or other pills.   Progress Toward Treatment Goals:  No flowsheet data found.  Self Care Goals & Plans:  No flowsheet data found.  No flowsheet data found.   Care Management & Community Referrals:  No flowsheet data found.

## 2014-07-06 NOTE — Progress Notes (Signed)
INTERNAL MEDICINE TEACHING ATTENDING ADDENDUM - Cassidy Whiting, MD: I reviewed and discussed at the time of visit with the resident Dr. Ngo, the patient's medical history, physical examination, diagnosis and results of pertinent tests and treatment and I agree with the patient's care as documented.  

## 2014-07-19 ENCOUNTER — Telehealth: Payer: Self-pay | Admitting: *Deleted

## 2014-07-19 ENCOUNTER — Ambulatory Visit (INDEPENDENT_AMBULATORY_CARE_PROVIDER_SITE_OTHER): Payer: Medicare Other | Admitting: Internal Medicine

## 2014-07-19 ENCOUNTER — Encounter: Payer: Self-pay | Admitting: Internal Medicine

## 2014-07-19 DIAGNOSIS — R197 Diarrhea, unspecified: Secondary | ICD-10-CM

## 2014-07-19 LAB — BASIC METABOLIC PANEL WITH GFR
BUN: 9 mg/dL (ref 6–23)
CO2: 23 mEq/L (ref 19–32)
CREATININE: 0.9 mg/dL (ref 0.50–1.10)
Calcium: 8.7 mg/dL (ref 8.4–10.5)
Chloride: 107 mEq/L (ref 96–112)
GFR, EST NON AFRICAN AMERICAN: 80 mL/min
GLUCOSE: 79 mg/dL (ref 70–99)
Potassium: 4.4 mEq/L (ref 3.5–5.3)
Sodium: 138 mEq/L (ref 135–145)

## 2014-07-19 MED ORDER — LOPERAMIDE HCL 2 MG PO TABS: ORAL_TABLET | ORAL | Status: DC

## 2014-07-19 NOTE — Progress Notes (Signed)
   Subjective:    Patient ID: [name] SessionAisha [name], female    DOB: [birth year], 44 y.o.   MRN: [id number]  HPI  44 yo female with hx of chronic asthma, arthritis, obesity here for evaluation of her acute diarrhea.  Patient was doing fine until yesterday afternoon. She went to [employer] to pick up lunch. She picked up their appetizer tray containing fried potato skin, chicken fingers, and cooked shrimp. She also had some sauce with it, possibly containing mayonnaise and honey mustard. About 1/2 hour after she at her lunch she started having diarrhea. She had total of 22 diarrhea yesterday from afternoon until she went to bed, water, non bloody. Over night she had 3 diarrheas. This morning she had 3 more diarrhea episodes. Last diarrhea at 9:25 AM. No nausea or vomitting. Had occasional ab cramps but no severe ab pain. Has not eaten or drank much since yesterday, only had ginger ale. Tried some "anti diarrhea" medicine, not sure which one. Did not help much.  Patient's husband also had some of her potato skin but no symptoms. No other sick contacts. No recent travel. No recent antibiotic use. No new meds. No fever, some chills.   Additionally, patient had some chest pain yesterday before going to lunch when she was in her car. It was started by a strong "asphalt" smell from the road. Chest pain lasted 3 hours and resolved by itself. No SOB at that time. No chest pain since then. Breathing fine, no wheezing. Does have hx of asthma, did not feel like an asthma exacerbation.   Review of Systems  Constitutional: Positive for chills. Negative for fever.  HENT: Negative for rhinorrhea, sinus pressure and sore throat.   Eyes: Negative for photophobia and visual disturbance.  Respiratory: Negative for cough, chest tightness, shortness of breath and wheezing.   Cardiovascular: Negative for chest pain, palpitations and leg swelling.  Gastrointestinal: Positive for diarrhea. Negative for nausea, vomiting, abdominal  pain and blood in stool.  Endocrine: Negative for polyphagia and polyuria.  Genitourinary: Negative for dysuria.  Musculoskeletal: Negative.   Skin: Negative for rash.  Allergic/Immunologic: Negative.   Neurological: Negative for dizziness, weakness and numbness.  Hematological: Negative.        Objective:   Physical Exam  Constitutional: She is oriented to person, place, and time. She appears well-developed and well-nourished. No distress.  Obese female  HENT:  Head: Normocephalic and atraumatic.  Right Ear: External ear normal.  Left Ear: External ear normal.  Mouth/Throat: Oropharynx is clear and moist.  Eyes: EOM are normal. Pupils are equal, round, and reactive to light. Right eye exhibits no discharge. Left eye exhibits no discharge.  Neck: Normal range of motion. No JVD present.  Cardiovascular: Normal rate, regular rhythm and normal heart sounds.  Exam reveals no gallop and no friction rub.   No murmur heard. Pulmonary/Chest: Effort normal and breath sounds normal. No respiratory distress. She has no wheezes. She has no rales. She exhibits no tenderness.  Abdominal: Soft. She exhibits no distension and no mass. There is no tenderness. There is no rebound.  Musculoskeletal: Normal range of motion. She exhibits no edema or tenderness.  Has chronic knee pain with some tenderness on the right knee  Neurological: She is alert and oriented to person, place, and time. No cranial nerve deficit. Coordination normal.  Skin: Skin is warm. No rash noted. She is not diaphoretic.       Assessment & Plan:  See problem based a&p.

## 2014-07-19 NOTE — Telephone Encounter (Signed)
Pt called - had chest pain 07/18/14 for 3 hours and when chest pain stopped - has had watery diarrhea 22 times. Appt made for today 10:45AM Dr Rosalene BillingsAlmed. Stanton KidneyDebra Margaretmary Prisk RN 07/19/14 9:40AM

## 2014-07-19 NOTE — Patient Instructions (Signed)
Your diarrhea is likely secondary to a toxin related problem that you likely ingested in your food.  Keep drinking water or gatorade to make sure you are hydrated.  Call us if symptoms do not get better within next 2 days.  Imodium: Take 1 tablet after every watery bowel movement. Do not take more than 6 tablets in 24 hours.

## 2014-07-19 NOTE — Assessment & Plan Note (Signed)
Patient was doing fine until yesterday afternoon. She went to [employer] to pick up lunch. She picked up their appetizer tray containing fried potato skin, chicken fingers, and cooked shrimp. She also had some sauce with it, possibly containing mayonnaise and honey mustard. About 1/2 hour after she at her lunch she started having diarrhea. She had total of 22 diarrhea yesterday from afternoon until she went to bed, water, non bloody. Over night she had 3 diarrheas. This morning she had 3 more diarrhea episodes. Last diarrhea at 9:25 AM. No nausea or vomitting. Had occasional ab cramps but no severe ab pain. Has not eaten or drank much since yesterday, only had gingerale. Tried some "anti diarrhea" medicine, not sure which one. Did not help much.  Patient's husband also had some of her potato skin but no symptoms. No other sick contacts. No recent travel. No recent antibiotic use. No new meds. No fever, some chills.   This is likely toxin related diarrhea given the acuity. Staph toxin is mostly the cause given symptoms started within 1 hour. However, does not have nausea/vomitting.  Will treat symptomatically. Already feeling somewhat better as diarrhea episodes has decreased. Does not look severely dehydrated on exam. Will check BMET to make sure electrolytes are ok. Asked patient to drink plenty of water and Gatorade. Asked to call us back if she has fever/ bloody bowel movements, ab pain, or if symptoms don't improve within next 1-2 days.  Gave script for imodium to take 1 tablet after every bowel movement, not to exceed 6 tabs in 24 hours.

## 2014-07-22 NOTE — Progress Notes (Signed)
Medicine attending: Medical history, presenting problems, physical findings, and medications, reviewed with Dr Tasrif Ahmed on the day of the patient visit  and I concur with his evaluation and management plan. 

## 2014-07-24 ENCOUNTER — Encounter: Payer: Self-pay | Admitting: *Deleted

## 2014-08-21 ENCOUNTER — Other Ambulatory Visit: Payer: Self-pay | Admitting: Internal Medicine

## 2014-08-30 ENCOUNTER — Telehealth: Payer: Self-pay | Admitting: Internal Medicine

## 2014-08-30 NOTE — Telephone Encounter (Signed)
Call to patient to confirm appointment for 09/03/14 at 8:15 lmtcb ° °

## 2014-09-03 ENCOUNTER — Encounter: Payer: Self-pay | Admitting: Internal Medicine

## 2014-09-03 ENCOUNTER — Ambulatory Visit (INDEPENDENT_AMBULATORY_CARE_PROVIDER_SITE_OTHER): Payer: Medicare Other | Admitting: Internal Medicine

## 2014-09-03 VITALS — BP 118/86 | HR 67 | Temp 98.3°F | Ht 66.0 in | Wt 304.3 lb

## 2014-09-03 DIAGNOSIS — R300 Dysuria: Secondary | ICD-10-CM | POA: Insufficient documentation

## 2014-09-03 DIAGNOSIS — K648 Other hemorrhoids: Secondary | ICD-10-CM

## 2014-09-03 DIAGNOSIS — M25562 Pain in left knee: Secondary | ICD-10-CM | POA: Diagnosis not present

## 2014-09-03 DIAGNOSIS — M25561 Pain in right knee: Secondary | ICD-10-CM

## 2014-09-03 DIAGNOSIS — Z6841 Body Mass Index (BMI) 40.0 and over, adult: Secondary | ICD-10-CM | POA: Diagnosis not present

## 2014-09-03 MED ORDER — MELOXICAM 15 MG PO TABS: 15.0000 mg | ORAL_TABLET | Freq: Every day | ORAL | Status: DC

## 2014-09-03 MED ORDER — FLUOCINONIDE 0.05 % EX CREA: 1.0000 "application " | TOPICAL_CREAM | Freq: Two times a day (BID) | CUTANEOUS | Status: DC

## 2014-09-03 MED ORDER — NITROFURANTOIN MONOHYD MACRO 100 MG PO CAPS: 100.0000 mg | ORAL_CAPSULE | Freq: Two times a day (BID) | ORAL | Status: AC

## 2014-09-03 NOTE — Assessment & Plan Note (Signed)
At the end of the visit she requests an antibiotic for dysuria. She reports that it has been intermittent, it looks like she was treated for a UTI in April.  She notes the symptoms have come and gone since then.  She denies fever, chills, increased frequency. - U/A and urine dip. - Dip suggestive. Will Rx Macrobid x 7 days.

## 2014-09-03 NOTE — Assessment & Plan Note (Signed)
-  We discussed that weight loss will help her knee pain greatly. - We discussed weight loss strategies and focused on calorie counting.

## 2014-09-03 NOTE — Progress Notes (Signed)
[facility] Subjective:   Patient ID: Cassidy [name] female   DOB: 05/12/1970 44 y.o.   MRN: [id number]  HPI: [name] is a 44 y.o. female with a PMH detailed below who presents for left knee pain.  Knee pain: she has chronic bilateral knee pain which she has been seeing [place] for for about 2 years.  Lately she reports her left knee is hurting more than the right, she describes the pain as sharp, worse with standing and walking, worse at end of day, she feels the sensation the her knee may "give out" and that there is something "poking from inside."  She has previously been diagnosed with arthritis and notes that Mobic has helped at the  dosage.  She has not been taking this lately.  Obesity: She has been trying to eat healthier but notes that she has yet to see substantial weight loss. She has an app on her phone to help. Wt Readings from Last 5 Encounters:  09/03/14 304 lb 4.8 oz (138.03 kg)  07/19/14 303 lb 12.8 oz (137.803 kg)  07/05/14 308 lb 12.8 oz (140.071 kg)  06/16/14 308 lb (139.708 kg)  05/02/14 306 lb 1.6 oz (138.846 kg)   BRBPR - See A&P below for further HPI details  Dysuria -See A&P below for further details.    Past Medical History  Diagnosis Date  . Asthma   . Arthritis    Current Outpatient Prescriptions  Medication Sig Dispense Refill  . ADVAIR DISKUS 250-50 MCG/DOSE AEPB INHALE 1 PUFF INTO THE LUNGS TWICE DAILY 60 each 3  . albuterol (PROVENTIL HFA;VENTOLIN HFA) 108 (90 BASE) MCG/ACT inhaler Inhale 2 puffs into the lungs every 6 (six) hours as needed for wheezing or shortness of breath. 1 Inhaler 11  . ferrous sulfate 325 (65 FE) MG tablet Take 1 tablet (325 mg total) by mouth 3 (three) times daily with meals. 30 tablet 3  . fluocinonide cream (LIDEX) 0.05 % Apply 1 application topically 2 (two) times daily. 30 g 0  . meloxicam (MOBIC) 15 MG tablet Take 1 tablet (15 mg total) by mouth daily. 30 tablet 2  . nitrofurantoin,  macrocrystal-monohydrate, (MACROBID) 100 MG capsule Take 1 capsule (100 mg total) by mouth 2 (two) times daily. 14 capsule 0  . VENTOLIN HFA 108 (90 BASE) MCG/ACT inhaler INHALE 2 PUFFS INTO THE LUNGS EVERY 6 HOURS AS NEEDED WHEEZING OR SHORTNESS OF BREATH 18 g 0   No current facility-administered medications for this visit.   Family History  Problem Relation Age of Onset  . Asthma Sister   . Asthma Brother   . Asthma Maternal Grandmother   . Asthma Maternal Grandfather   . Prostate cancer Brother   . Colon cancer Brother 23    Diet about 10 years before. Was diagnosed in late 30s.  . Throat cancer Paternal Uncle    History   Social History  . Marital Status: Single    Spouse Name: N/A  . Number of Children: N/A  . Years of Education: N/A   Social History Main Topics  . Smoking status: Never Smoker   . Smokeless tobacco: Never Used  . Alcohol Use: No  . Drug Use: No  . Sexual Activity: Yes    Birth Control/ Protection: Surgical   Other Topics Concern  . None   Social History Narrative   She is married and has 5 kids. 3 of them has asthma.   3 boys and 2 girls. Oldest one  is 42 year old and youngest 41 years old.      Moved to [place] from [place] in January 2014.    Review of Systems: Review of Systems  Constitutional: Negative for fever.  Eyes: Negative for blurred vision.  Respiratory: Negative for shortness of breath.   Cardiovascular: Negative for chest pain.  Gastrointestinal: Positive for constipation and blood in stool. Negative for nausea, vomiting, abdominal pain, diarrhea and melena.  Musculoskeletal: Positive for joint pain. Negative for back pain.  Neurological: Negative for dizziness and headaches.     Objective:  Physical Exam: Filed Vitals:   09/03/14 0831  BP: 118/86  Pulse: 67  Temp: 98.3 F (36.8 C)  TempSrc: Oral  Height:  (1.676 m)  Weight: 304 lb 4.8 oz (138.03 kg)  SpO2: 100%   Physical Exam  Constitutional: She is  well-developed, well-nourished, and in no distress.  Cardiovascular: Normal rate and regular rhythm.   Pulmonary/Chest: Effort normal and breath sounds normal.  Abdominal: Soft. Bowel sounds are normal.  Musculoskeletal:       Right knee: She exhibits normal range of motion, no swelling, normal alignment, no LCL laxity, normal meniscus and no MCL laxity. Tenderness found. Medial joint line tenderness noted. No lateral joint line tenderness noted.       Left knee: She exhibits normal range of motion, no swelling, no effusion, normal alignment and normal meniscus. Tenderness found. Medial joint line and lateral joint line tenderness noted. No MCL and no LCL tenderness noted.  Nursing note and vitals reviewed.   Assessment & Plan:  Case discussed with Dr. Heide Spark  Internal hemorrhoid She has noticed blood on toliet paper.  Looking back to Colonoscopy in 2015 she had small internal hemorrhoids.  She did not realize she had hemorrhoids.  She does report she frequently strains to use the bathroom - We discussed avoiding straining including increaseing water intake as well as PRN colace stool softener,  Bilateral knee pain - Previous workup for RA negative, I have reviewed her Xray reports from 2014 which show early degenerative changes of both knees.  I have discussed with her that her symptoms and Xray findings are consistent with osteoarthritis.  She has previously had benefit from Meloxicam  daily. I have advised she resume this.  She is considering a steroid knee injection but will try the Mobic first. - Continue Mobic - Patient may return to clinic for steroid knee injection if requested.  Dysuria At the end of the visit she requests an antibiotic for dysuria. She reports that it has been intermittent, it looks like she was treated for a UTI in April.  She notes the symptoms have come and gone since then.  She denies fever, chills, increased frequency. - U/A and urine dip. - Dip  suggestive. Will Rx Macrobid x 7 days.  Morbid obesity with BMI of 45.0-49.9, adult -We discussed that weight loss will help her knee pain greatly. - We discussed weight loss strategies and focused on calorie counting.    Medications Ordered Meds ordered this encounter  Medications  . meloxicam (MOBIC) 15 MG tablet    Sig: Take 1 tablet (15 mg total) by mouth daily.    Dispense:  30 tablet    Refill:  2  . fluocinonide cream (LIDEX) 0.05 %    Sig: Apply 1 application topically 2 (two) times daily.    Dispense:  30 g    Refill:  0  . nitrofurantoin, macrocrystal-monohydrate, (MACROBID) 100 MG capsule  Sig: Take 1 capsule (100 mg total) by mouth 2 (two) times daily.    Dispense:  14 capsule    Refill:  0   Other Orders Orders Placed This Encounter  Procedures  . Culture, Urine  . Urinalysis, Reflex Microscopic   Follow Up: Return 2-3 months.

## 2014-09-03 NOTE — Assessment & Plan Note (Signed)
She has noticed blood on toliet paper.  Looking back to Colonoscopy in 2015 she had small internal hemorrhoids.  She did not realize she had hemorrhoids.  She does report she frequently strains to use the bathroom - We discussed avoiding straining including increaseing water intake as well as PRN colace stool softener,

## 2014-09-03 NOTE — Assessment & Plan Note (Signed)
-   Previous workup for RA negative, I have reviewed her Xray reports from 2014 which show early degenerative changes of both knees.  I have discussed with her that her symptoms and Xray findings are consistent with osteoarthritis.  She has previously had benefit from Meloxicam 15mg  daily. I have advised she resume this.  She is considering a steroid knee injection but will try the Mobic first. - Continue Mobic - Patient may return to clinic for steroid knee injection if requested.

## 2014-09-03 NOTE — Patient Instructions (Signed)
General Instructions:  Please call for an appointment if you want that steroid knee injection.  Please resume taking Mobic.  I will call with the results of the urine study  Please bring your medicines with you each time you come to clinic.  Medicines may include prescription medications, over-the-counter medications, herbal remedies, eye drops, vitamins, or other pills.   Progress Toward Treatment Goals:  No flowsheet data found.  Self Care Goals & Plans:  No flowsheet data found.  No flowsheet data found.   Care Management & Community Referrals:  No flowsheet data found.

## 2014-09-04 LAB — URINALYSIS, MICROSCOPIC ONLY: CASTS: NONE SEEN

## 2014-09-04 LAB — URINALYSIS, ROUTINE W REFLEX MICROSCOPIC
Bilirubin Urine: NEGATIVE
GLUCOSE, UA: NEGATIVE mg/dL
HGB URINE DIPSTICK: NEGATIVE
Ketones, ur: NEGATIVE mg/dL
Nitrite: NEGATIVE
PH: 6 (ref 5.0–8.0)
Protein, ur: NEGATIVE mg/dL
Specific Gravity, Urine: 1.024 (ref 1.005–1.030)
Urobilinogen, UA: 0.2 mg/dL (ref 0.0–1.0)

## 2014-09-04 NOTE — Progress Notes (Signed)
INTERNAL MEDICINE TEACHING ATTENDING ADDENDUM - Kaedon Fanelli, MD: I reviewed and discussed at the time of visit with the resident Dr. Hoffman, the patient's medical history, physical examination, diagnosis and results of pertinent tests and treatment and I agree with the patient's care as documented.  

## 2014-09-06 LAB — URINE CULTURE: Colony Count: >=100000

## 2014-10-04 ENCOUNTER — Other Ambulatory Visit: Payer: Self-pay | Admitting: Internal Medicine

## 2014-11-28 ENCOUNTER — Other Ambulatory Visit: Payer: Self-pay | Admitting: *Deleted

## 2014-11-28 MED ORDER — FLUOCINONIDE 0.05 % EX CREA: 1.0000 "application " | TOPICAL_CREAM | Freq: Two times a day (BID) | CUTANEOUS | Status: DC

## 2015-02-01 ENCOUNTER — Ambulatory Visit (INDEPENDENT_AMBULATORY_CARE_PROVIDER_SITE_OTHER): Payer: Medicare Other | Admitting: Internal Medicine

## 2015-02-01 ENCOUNTER — Encounter: Payer: Self-pay | Admitting: Internal Medicine

## 2015-02-01 ENCOUNTER — Encounter: Payer: Medicare Other | Admitting: Internal Medicine

## 2015-02-01 VITALS — BP 132/83 | HR 60 | Temp 98.3°F | Ht 65.0 in | Wt 309.8 lb

## 2015-02-01 DIAGNOSIS — N3281 Overactive bladder: Secondary | ICD-10-CM | POA: Diagnosis not present

## 2015-02-01 DIAGNOSIS — M1990 Unspecified osteoarthritis, unspecified site: Secondary | ICD-10-CM

## 2015-02-01 MED ORDER — MELOXICAM 15 MG PO TABS: 15.0000 mg | ORAL_TABLET | Freq: Every day | ORAL | Status: DC

## 2015-02-01 MED ORDER — DICLOFENAC SODIUM 1 % TD GEL: 2.0000 g | Freq: Four times a day (QID) | TRANSDERMAL | Status: DC

## 2015-02-01 MED ORDER — SOLIFENACIN SUCCINATE 5 MG PO TABS: 5.0000 mg | ORAL_TABLET | Freq: Every day | ORAL | Status: DC

## 2015-02-01 NOTE — Progress Notes (Signed)
Subjective:    Patient ID: Cassidy [name] Session, female    DOB: 10-Sep-1970, 44 y.o.   MRN: [id number]  HPI Cassidy [name] is a 44 y.o. female with PMHx of obesity, asthma, arthritis who presents to the clinic for follow up for her osteoarthritis. Please see A&P for the status of the patient's chronic medical problems.   Past Medical History  Diagnosis Date  . Asthma   . Arthritis     Outpatient Encounter Prescriptions as of 02/01/2015  Medication Sig  . ADVAIR DISKUS 250-50 MCG/DOSE AEPB INHALE 1 PUFF INTO THE LUNGS TWICE DAILY  . diclofenac sodium (VOLTAREN) 1 % GEL Apply 2 g topically 4 (four) times daily.  . ferrous sulfate 325 (65 FE) MG tablet Take 1 tablet (325 mg total) by mouth 3 (three) times daily with meals.  . fluocinonide cream (LIDEX) 0.05 % Apply 1 application topically 2 (two) times daily.  . meloxicam (MOBIC) 15 MG tablet Take 1 tablet (15 mg total) by mouth daily.  . solifenacin (VESICARE) 5 MG tablet Take 1 tablet (5 mg total) by mouth daily.  . VENTOLIN HFA 108 (90 BASE) MCG/ACT inhaler INHALE 2 PUFFS INTO THE LUNGS EVERY 6 HOURS AS NEEDED FOR WHEEZING OR SHORTNESS OF BREATH  . [DISCONTINUED] meloxicam (MOBIC) 15 MG tablet Take 1 tablet (15 mg total) by mouth daily.   No facility-administered encounter medications on file as of 02/01/2015.    Family History  Problem Relation Age of Onset  . Asthma Sister   . Asthma Brother   . Asthma Maternal Grandmother   . Asthma Maternal Grandfather   . Prostate cancer Brother   . Colon cancer Brother 13    Diet about 10 years before. Was diagnosed in late 30s.  . Throat cancer Paternal Uncle     Social History   Social History  . Marital Status: Single    Spouse Name: N/A  . Number of Children: N/A  . Years of Education: N/A   Occupational History  . Not on file.   Social History Main Topics  . Smoking status: Never Smoker   . Smokeless tobacco: Never Used  . Alcohol Use: No  . Drug Use: No  . Sexual Activity:  Yes    Birth Control/ Protection: Surgical   Other Topics Concern  . Not on file   Social History Narrative   She is married and has 5 kids. 3 of them has asthma.   3 boys and 2 girls. Oldest one is 42 year old and youngest 42 years old.      Moved to [place] from [place] in January 2014.     Review of Systems General: Denies fever, chills, fatigue.  Respiratory: Denies SOB, DOE.   Cardiovascular: Denies chest pain and palpitations.  Gastrointestinal: Denies nausea, vomiting, abdominal pain, blood in stool and abdominal distention.  GU: Patient admits to overactive bladder with urinary urgency.  Musculoskeletal: Admits to chronic knee pain L > R and chronic pain in her hands- mainly the PIP joints. Denies myalgias.  Skin: Denies rash.  Neurological: Denies dizziness, headaches, weakness, lightheadedness, numbness    Objective:   Physical Exam Filed Vitals:   02/01/15 0935  BP: 132/83  Pulse: 60  Temp: 98.3 F (36.8 C)  TempSrc: Oral  Height:  (1.651 m)  Weight: 309 lb 12.8 oz (140.524 kg)  SpO2: 100%   General: Vital signs reviewed.  Patient is obese,  acute distress and cooperative with exam.   Cardiovascular: RRR, S1 normal,  S2 normal, no murmurs, gallops, or rubs. Pulmonary/Chest: Clear to auscultation bilaterally, no wheezes, rales, or rhonchi. Musculoskeletal: Normal MCP, PIP and DIP joints. No evidence of enlargement or effusion, no erythema, mild tenderness on palpation of PIP, normal ROM and strength. Bilaral knee pain with extension, L >R. Normal flexion. No evidence of effusion. Pain on palpation of knees bilaterally.  Extremities: No lower extremity edema bilaterally Skin: Warm, dry and intact. No rashes or erythema. Psychiatric: Normal mood and affect. speech and behavior is normal. Cognition and memory are normal.     Assessment & Plan:   Please see problem based assessment and plan.

## 2015-02-01 NOTE — Assessment & Plan Note (Signed)
Patient presents with uncontrolled pain in her bilateral knees and hands despite Mobic 15 mg daily. She has been diagnosed with osteoarthritis as previous work up was negative for ESR, ANA, RF. She follows with Rheumatology. Pain is worst in left knee and in her PIP joints. She has never had steroid injections in her knees although her rheumatologist did discuss this with her. Patient also requests a handicap sticker as she has significant trouble ambulating. Her osteoarthritis is likely due to and exacerbation by her obesity. We discussed weight loss strategies focusing on diet control while she waited to find a gym.  Plan: -Continue Mobic 15 mg daily -Voltaren gel QID Knees and hands -Follow up with Rheumatology for injections- if they do not do this, patient can follow up with [place] -Weight loss -Provided with handicap sticker form

## 2015-02-01 NOTE — Progress Notes (Signed)
Internal Medicine Clinic Attending  Case discussed with Dr. Richardson soon after the resident saw the patient.  We reviewed the resident's history and exam and pertinent patient test results.  I agree with the assessment, diagnosis, and plan of care documented in the resident's note. 

## 2015-02-01 NOTE — Assessment & Plan Note (Signed)
Patient complains of an overactive bladder. She does not realize she has to urinate until it becomes urgent. She often cannot make it to the bathroom in time. She has seen a specialist before who gave her a sample of Vesicare. She is requesting a refill.   Plan: -Trial of Vesicare -If not covered by insurance, consider oxybutynin

## 2015-02-01 NOTE — Patient Instructions (Signed)
CONTINUE TAKING MOBIC 15 MG DAILY. TRY VOLTAREN GEL FOR YOUR KNEE AND HAND PAIN. FOLLOW UP WITH YOUR RHEUMATOLOGIST. IF THEY DO NOT DO KNEE INJECTIONS, LET US KNOW AND WE CAN DO THEM HERE.

## 2015-02-07 ENCOUNTER — Other Ambulatory Visit: Payer: Self-pay | Admitting: Internal Medicine

## 2015-02-13 ENCOUNTER — Other Ambulatory Visit: Payer: Self-pay | Admitting: Internal Medicine

## 2015-03-04 ENCOUNTER — Ambulatory Visit: Payer: Medicare Other | Admitting: Internal Medicine

## 2015-03-05 ENCOUNTER — Other Ambulatory Visit: Payer: Self-pay | Admitting: Internal Medicine

## 2015-03-05 ENCOUNTER — Other Ambulatory Visit: Payer: Self-pay | Admitting: *Deleted

## 2015-03-05 DIAGNOSIS — N3281 Overactive bladder: Secondary | ICD-10-CM

## 2015-03-06 ENCOUNTER — Ambulatory Visit (INDEPENDENT_AMBULATORY_CARE_PROVIDER_SITE_OTHER): Payer: Medicare Other | Admitting: Internal Medicine

## 2015-03-06 ENCOUNTER — Encounter: Payer: Self-pay | Admitting: Internal Medicine

## 2015-03-06 VITALS — BP 130/83 | HR 68 | Temp 97.7°F | Ht 65.0 in | Wt 310.9 lb

## 2015-03-06 DIAGNOSIS — M170 Bilateral primary osteoarthritis of knee: Secondary | ICD-10-CM | POA: Diagnosis not present

## 2015-03-06 DIAGNOSIS — M1990 Unspecified osteoarthritis, unspecified site: Secondary | ICD-10-CM

## 2015-03-06 DIAGNOSIS — N3941 Urge incontinence: Secondary | ICD-10-CM | POA: Diagnosis not present

## 2015-03-06 DIAGNOSIS — Z7951 Long term (current) use of inhaled steroids: Secondary | ICD-10-CM

## 2015-03-06 DIAGNOSIS — N3281 Overactive bladder: Secondary | ICD-10-CM

## 2015-03-06 DIAGNOSIS — Z6841 Body Mass Index (BMI) 40.0 and over, adult: Secondary | ICD-10-CM | POA: Diagnosis not present

## 2015-03-06 DIAGNOSIS — J4520 Mild intermittent asthma, uncomplicated: Secondary | ICD-10-CM

## 2015-03-06 DIAGNOSIS — J45909 Unspecified asthma, uncomplicated: Secondary | ICD-10-CM | POA: Diagnosis not present

## 2015-03-06 MED ORDER — MELOXICAM 15 MG PO TABS: 15.0000 mg | ORAL_TABLET | Freq: Every day | ORAL | Status: DC

## 2015-03-06 MED ORDER — FLUTICASONE-SALMETEROL 250-50 MCG/DOSE IN AEPB: INHALATION_SPRAY | RESPIRATORY_TRACT | Status: DC

## 2015-03-06 MED ORDER — SOLIFENACIN SUCCINATE 5 MG PO TABS: 5.0000 mg | ORAL_TABLET | Freq: Every day | ORAL | Status: DC

## 2015-03-06 MED ORDER — ACETAMINOPHEN 325 MG PO TABS: 650.0000 mg | ORAL_TABLET | ORAL | Status: DC | PRN

## 2015-03-06 NOTE — Progress Notes (Signed)
Patient ID: Cassidy [name], female   DOB: 1970-09-20, 44 y.o.   MRN: [id number] [facility] Subjective:   Patient ID: Cassidy [name] female   DOB: September 10, 1970 44 y.o.   MRN: [id number]  HPI: Cassidy [name] is a 43 y.o. female with a history of osteoarthritis in both of her knees, obesity, and mild intermittent asthma, presenting to clinic with bilateral knee pain.  She tells me she lost her balance and fell a week ago, and had some soreness in her right leg, but this has resolved completely. She tells me her chronic knee pain is the same. She recently joined [employer], and is very interested in starting water aerobics. She is requesting refill of her meloxicam today.  Please see the assessment and plan for the status of the patient's chronic medical problems.  Past Medical History  Diagnosis Date  . Asthma   . Arthritis    Current Outpatient Prescriptions  Medication Sig Dispense Refill  . ADVAIR DISKUS 250-50 MCG/DOSE AEPB INHALE 1 PUFF INTO THE LUNGS TWICE DAILY 1 each 0  . diclofenac sodium (VOLTAREN) 1 % GEL Apply 2 g topically 4 (four) times daily. 100 g 3  . ferrous sulfate 325 (65 FE) MG tablet Take 1 tablet (325 mg total) by mouth 3 (three) times daily with meals. 30 tablet 3  . fluocinonide cream (LIDEX) 0.05 % Apply 1 application topically 2 (two) times daily. 30 g 0  . meloxicam (MOBIC) 15 MG tablet Take 1 tablet (15 mg total) by mouth daily. 30 tablet 6  . meloxicam (MOBIC) 15 MG tablet TAKE 1 TABLET(15 MG) BY MOUTH DAILY 30 tablet 0  . solifenacin (VESICARE) 5 MG tablet Take 1 tablet (5 mg total) by mouth daily. 30 tablet 0  . VENTOLIN HFA 108 (90 BASE) MCG/ACT inhaler INHALE 2 PUFFS INTO THE LUNGS EVERY 6 HOURS AS NEEDED FOR WHEEZING OR SHORTNESS OF BREATH 18 g 0   No current facility-administered medications for this visit.   Family History  Problem Relation Age of Onset  . Asthma Sister   . Asthma Brother   . Asthma Maternal Grandmother   . Asthma  Maternal Grandfather   . Prostate cancer Brother   . Colon cancer Brother 20    Diet about 10 years before. Was diagnosed in late 30s.  . Throat cancer Paternal Uncle    Social History   Social History  . Marital Status: Single    Spouse Name: N/A  . Number of Children: N/A  . Years of Education: N/A   Social History Main Topics  . Smoking status: Never Smoker   . Smokeless tobacco: Never Used  . Alcohol Use: No  . Drug Use: No  . Sexual Activity: Yes    Birth Control/ Protection: Surgical   Other Topics Concern  . None   Social History Narrative   She is married and has 5 kids. 3 of them has asthma.   3 boys and 2 girls. Oldest one is 42 year old and youngest 42 years old.      Moved to [place] from [place] in January 2014.    Review of Systems  Constitutional: Negative for fever, chills and malaise/fatigue.  Respiratory: Negative for shortness of breath.   Cardiovascular: Negative for chest pain.  Musculoskeletal: Positive for joint pain and falls. Negative for myalgias, back pain and neck pain.  Skin: Negative for rash.   Objective:  Physical Exam: Filed Vitals:   03/06/15 0918  BP: 130/83  Pulse: 68  Temp: 97.7 F (36.5 C)  TempSrc: Oral  Height: 5\' 5"  (1.651 m)  Weight: 310 lb 14.4 oz (141.023 kg)  SpO2: 100%   General: obese black woman resting in bed comfortably, appropriately conversational HEENT: no scleral icterus, extra-ocular muscles intact, oropharynx without lesions Cardiac: regular rate and rhythm, no rubs, murmurs or gallops Pulm: breathing well, clear to auscultation bilaterally Abd: bowel sounds normal, soft, nondistended, non-tender Ext: bilateral knees, hips, and ankles appear normal, without objective signs of inflammation, with full range of motion and strength Lymph: no cervical or supraclavicular lymphadenopathy Skin: no rash, hair, or nail changes Neuro: alert and oriented X3, cranial nerves II-XII grossly intact, moving all  extremities well  Assessment & Plan:  Case discussed with Dr. Josem KaufmannKlima  Arthritis She has bilateral knee osteoarthritis that I believe is primarily related to her obesity. She's been evaluated by rheumatology and had normal autoimmune serologies. Meloxicam controls her pain very well, but has been causing a bit of an upset stomach for her. She denies any melena or hematochezia. I recommended she start taking Tylenol, with the goal of decreasing the dose of meloxicam to prevent gastritis.  She has great insight into the relationship between her weight and her knee pain. She tells me [facility] [employer] [employer] [facility] [employer] and wants to start taking water aerobics classes sometime in the next couple weeks.  Asthma, chronic I refilled her Advair discus today. She tells me her asthma is very well controlled.  Urge incontinence I refilled her Vesicare today. She says this is been helping her urge incontinence tremendously.   Medications Ordered No orders of the defined types were placed in this encounter.   Other Orders No orders of the defined types were placed in this encounter.   Follow Up: No Follow-up on file.

## 2015-03-06 NOTE — Assessment & Plan Note (Signed)
I refilled her Vesicare today. She says this is been helping her urge incontinence tremendously.

## 2015-03-06 NOTE — Patient Instructions (Signed)
Miss Cassidy [name],  It was a pleasure meeting you today!  I'm glad to hear that your right leg pain has gotten so much better since her fall.  For the arthritis in her knees, like we discussed, losing weight is hands down the best thing you can do. It sounds like you have a really good plan of getting in the pole for some water aerobics. If that doesn't work, give the elliptical machine a try. Don't hesitate to give us a call if you like to talk to our dietitian again.  I've refilled your meloxicam, Advair, and Vesicare today.  We'll see you in 6 months for another general follow-up appointment.  Take care, and have a Merry Christmas, Dr. Earnest ConroyFlores

## 2015-03-06 NOTE — Progress Notes (Signed)
I saw and evaluated the patient.  I personally confirmed the key portions of Dr. Flores' history and exam and reviewed pertinent patient test results.  The assessment, diagnosis, and plan were formulated together and I agree with the documentation in the resident's note. 

## 2015-03-06 NOTE — Assessment & Plan Note (Signed)
I refilled her Advair discus today. She tells me her asthma is very well controlled.

## 2015-03-06 NOTE — Addendum Note (Signed)
Addended by: Doneen PoissonKLIMA, Fatma Rutten D on: 03/06/2015 10:07 AM   Modules accepted: Level of Service

## 2015-03-06 NOTE — Assessment & Plan Note (Addendum)
She has bilateral knee osteoarthritis that I believe is primarily related to her obesity. She's been evaluated by rheumatology and had normal autoimmune serologies. Meloxicam controls her pain very well, but has been causing a bit of an upset stomach for her. She denies any melena or hematochezia. I recommended she start taking Tylenol, with the goal of decreasing the dose of meloxicam to prevent gastritis.  She has great insight into the relationship between her weight and her knee pain. She tells me she recently joined [facility] [employer] and wants to start taking water aerobics classes sometime in the next couple weeks.

## 2015-03-28 ENCOUNTER — Ambulatory Visit: Payer: Medicare Other | Admitting: Internal Medicine

## 2015-04-02 ENCOUNTER — Other Ambulatory Visit: Payer: Self-pay | Admitting: Internal Medicine

## 2015-04-02 ENCOUNTER — Encounter: Payer: Self-pay | Admitting: Pulmonary Disease

## 2015-04-02 ENCOUNTER — Ambulatory Visit (INDEPENDENT_AMBULATORY_CARE_PROVIDER_SITE_OTHER): Payer: Medicare Other | Admitting: Pulmonary Disease

## 2015-04-02 VITALS — BP 116/77 | HR 70 | Temp 98.3°F | Wt 312.2 lb

## 2015-04-02 DIAGNOSIS — R1310 Dysphagia, unspecified: Secondary | ICD-10-CM | POA: Diagnosis present

## 2015-04-02 MED ORDER — ESOMEPRAZOLE MAGNESIUM 40 MG PO CPDR: 40.0000 mg | DELAYED_RELEASE_CAPSULE | Freq: Every day | ORAL | Status: DC

## 2015-04-02 NOTE — Progress Notes (Signed)
   Subjective:    Patient ID: [name] Session, female    DOB: 02-Jan-1971, 45 y.o.   MRN: [id number]  HPI Ms. Danaye Sobh is a 45 year old woman with history of asthma and arthritis presenting for evaluation of difficulty swallowing.  Her symptoms started 2 weeks ago. Started with dry cough. She had coughing while eating. No choking sensation. A couple days later, she started to feel like something stuck in her chest. Occurs with solids. Intermittent chest pain, radiating to back. No associated dyspnea. She has occasional odynophagia that is a burning sensation. She has nausea but no vomiting. She has regurgitation. No hemetemesis, hematochezia or melena. She has never had a similar episode before.  Uncle had throat cancer related to alcohol.  No history of GERD.  Review of Systems  Constitutional: no fevers/chills Respiratory: no shortness of breath Integument: no rash Hematologic/lymphatic: no bleeding/bruising, no edema  Past Medical History  Diagnosis Date  . Asthma   . Arthritis     Current Outpatient Prescriptions on File Prior to Visit  Medication Sig Dispense Refill  . acetaminophen (TYLENOL) 325 MG tablet Take 2 tablets (650 mg total) by mouth every 4 (four) hours as needed. 100 tablet 2  . diclofenac sodium (VOLTAREN) 1 % GEL Apply 2 g topically 4 (four) times daily. 100 g 3  . ferrous sulfate 325 (65 FE) MG tablet Take 1 tablet (325 mg total) by mouth 3 (three) times daily with meals. 30 tablet 3  . fluocinonide cream (LIDEX) 0.05 % Apply 1 application topically 2 (two) times daily. 30 g 0  . Fluticasone-Salmeterol (ADVAIR DISKUS) 250-50 MCG/DOSE AEPB INHALE 1 PUFF INTO THE LUNGS TWICE DAILY 1 each 0  . meloxicam (MOBIC) 15 MG tablet Take 1 tablet (15 mg total) by mouth daily. 30 tablet 6  . solifenacin (VESICARE) 5 MG tablet Take 1 tablet (5 mg total) by mouth daily. 30 tablet 0  . VENTOLIN HFA 108 (90 BASE) MCG/ACT inhaler INHALE 2 PUFFS INTO THE LUNGS EVERY 6 HOURS AS  NEEDED FOR WHEEZING OR SHORTNESS OF BREATH 18 g 0   No current facility-administered medications on file prior to visit.    Today's Vitals   04/02/15 1014  BP: 116/77  Pulse: 70  Temp: 98.3 F (36.8 C)  TempSrc: Oral  Weight: 312 lb 3.2 oz (141.613 kg)  SpO2: 100%   Objective:   Physical Exam  Constitutional: She appears well-developed and well-nourished. No distress.  HENT:  Mouth/Throat: Oropharynx is clear and moist. No oropharyngeal exudate.  Cardiovascular: Normal rate, regular rhythm and normal heart sounds.   Pulmonary/Chest: Effort normal and breath sounds normal. She has no wheezes. She has no rales.  Abdominal: Soft. She exhibits no distension. There is no tenderness.   Assessment & Plan:  Please refer to problem based charting.

## 2015-04-03 ENCOUNTER — Other Ambulatory Visit: Payer: Self-pay | Admitting: Internal Medicine

## 2015-04-03 DIAGNOSIS — K219 Gastro-esophageal reflux disease without esophagitis: Secondary | ICD-10-CM | POA: Insufficient documentation

## 2015-04-03 NOTE — Assessment & Plan Note (Signed)
Assessment: 2 week course of dysphagia. Differential includes esophageal stricture, achalasia, carcinoma.  Plan: -Referral to GI. -Will start empiric treatment with esomeprazole  daily

## 2015-04-04 NOTE — Progress Notes (Signed)
Case discussed with Dr. Krall at the time of the visit. We reviewed the resident's history and exam and pertinent patient test results. I agree with the assessment, diagnosis, and plan of care documented in the resident's note. 

## 2015-04-25 DIAGNOSIS — K219 Gastro-esophageal reflux disease without esophagitis: Secondary | ICD-10-CM | POA: Diagnosis not present

## 2015-04-25 DIAGNOSIS — R12 Heartburn: Secondary | ICD-10-CM | POA: Diagnosis not present

## 2015-04-25 DIAGNOSIS — R1310 Dysphagia, unspecified: Secondary | ICD-10-CM | POA: Diagnosis not present

## 2015-05-08 ENCOUNTER — Ambulatory Visit (INDEPENDENT_AMBULATORY_CARE_PROVIDER_SITE_OTHER): Payer: Medicare Other | Admitting: Internal Medicine

## 2015-05-08 ENCOUNTER — Ambulatory Visit (HOSPITAL_COMMUNITY)
Admission: RE | Admit: 2015-05-08 | Discharge: 2015-05-08 | Disposition: A | Discharge status: Home / Self Care | Payer: Medicare Other | Source: Ambulatory Visit | Attending: Internal Medicine | Admitting: Internal Medicine

## 2015-05-08 ENCOUNTER — Encounter: Payer: Self-pay | Admitting: Internal Medicine

## 2015-05-08 VITALS — BP 120/80 | HR 90 | Temp 100.0°F | Wt 319.9 lb

## 2015-05-08 DIAGNOSIS — R0782 Intercostal pain: Secondary | ICD-10-CM | POA: Diagnosis not present

## 2015-05-08 DIAGNOSIS — R1310 Dysphagia, unspecified: Secondary | ICD-10-CM | POA: Diagnosis not present

## 2015-05-08 MED ORDER — CYCLOBENZAPRINE HCL 10 MG PO TABS: 10.0000 mg | ORAL_TABLET | Freq: Three times a day (TID) | ORAL | Status: DC | PRN

## 2015-05-08 NOTE — Progress Notes (Signed)
Medicine attending: Medical history, presenting problems, physical findings, and medications, reviewed with resident physician Dr Erik Hoffman on the day of the patient visit and I concur with his evaluation and management plan. 

## 2015-05-08 NOTE — Assessment & Plan Note (Signed)
A: Dysphagia, improved  P: Recommended she take a trial of the PPI especially if the dysphagia returns or she develops heartburn.  I feel her pain is less likely from untreated GERD.

## 2015-05-08 NOTE — Patient Instructions (Signed)
General Instructions:   Please bring your medicines with you each time you come to clinic.  Medicines may include prescription medications, over-the-counter medications, herbal remedies, eye drops, vitamins, or other pills.   Progress Toward Treatment Goals:  No flowsheet data found.  Self Care Goals & Plans:  Self Care Goal 03/06/2015  Manage my medications take my medicines as prescribed; bring my medications to every visit; refill my medications on time  Eat healthy foods drink diet soda or water instead of juice or soda; eat more vegetables; eat foods that are low in salt; eat baked foods instead of fried foods; eat fruit for snacks and desserts    No flowsheet data found.   Care Management & Community Referrals:  No flowsheet data found.  Costochondritis Costochondritis, sometimes called Tietze syndrome, is a swelling and irritation (inflammation) of the tissue (cartilage) that connects your ribs with your breastbone (sternum). It causes pain in the chest and rib area. Costochondritis usually goes away on its own over time. It can take up to 6 weeks or longer to get better, especially if you are unable to limit your activities. CAUSES  Some cases of costochondritis have no known cause. Possible causes include:  Injury (trauma).  Exercise or activity such as lifting.  Severe coughing. SIGNS AND SYMPTOMS  Pain and tenderness in the chest and rib area.  Pain that gets worse when coughing or taking deep breaths.  Pain that gets worse with specific movements. DIAGNOSIS  Your health care provider will do a physical exam and ask about your symptoms. Chest X-rays or other tests may be done to rule out other problems. TREATMENT  Costochondritis usually goes away on its own over time. Your health care provider may prescribe medicine to help relieve pain. HOME CARE INSTRUCTIONS   Avoid exhausting physical activity. Try not to strain your ribs during normal activity. This would  include any activities using chest, abdominal, and side muscles, especially if heavy weights are used.  Apply ice to the affected area for the first 2 days after the pain begins.  Put ice in a plastic bag.  Place a towel between your skin and the bag.  Leave the ice on for 20 minutes, 2-3 times a day.  Only take over-the-counter or prescription medicines as directed by your health care provider. SEEK MEDICAL CARE IF:  You have redness or swelling at the rib joints. These are signs of infection.  Your pain does not go away despite rest or medicine. SEEK IMMEDIATE MEDICAL CARE IF:   Your pain increases or you are very uncomfortable.  You have shortness of breath or difficulty breathing.  You cough up blood.  You have worse chest pains, sweating, or vomiting.  You have a fever or persistent symptoms for more than 2-3 days.  You have a fever and your symptoms suddenly get worse. MAKE SURE YOU:   Understand these instructions.  Will watch your condition.  Will get help right away if you are not doing well or get worse.   This information is not intended to replace advice given to you by your health care provider. Make sure you discuss any questions you have with your health care provider.   Document Released: 12/10/2004 Document Revised: 12/21/2012 Document Reviewed: 10/04/2012 Elsevier Interactive Patient Education [employer]

## 2015-05-08 NOTE — Progress Notes (Signed)
[facility] Subjective:   Patient ID: Cassidy [name] female   DOB: 03/30/1970 45 y.o.   MRN: [id number]  HPI: Cassidy [name] is a 45 y.o. female with a PMH detailed below who presents for chest pain  She reports 2.5-3weeks of chest pain located in the middle of her chest "or sometimes back".  She does not appreciate any radiation.  Pain is described as sharp.  It lasts a couple of minutes to about 2 hours.  Moving forward or backwards worsens the pain.  Typically going to sleep will resolve the pain. She has bee ntaking 2 pills of motrin 2 or 3 times a day which helps some.  She does report some associated shortness of breath.  She was also recently seen for dysphasia, she notes that she saw GI was was given samples of protonix.  She was tentatively scheduled for an EGD on March 1st but told to call and cancel if the protonix helped.  She reports she has not had the issue again and has NOT been taking the protonix.    Past Medical History  Diagnosis Date  . Asthma   . Arthritis    Current Outpatient Prescriptions  Medication Sig Dispense Refill  . acetaminophen (TYLENOL) 325 MG tablet Take 2 tablets (650 mg total) by mouth every 4 (four) hours as needed. 100 tablet 2  . ADVAIR DISKUS 250-50 MCG/DOSE AEPB INHALE 1 PUFF BY MOUTH INTO THE LUNGS TWICE DAILY 60 each 0  . diclofenac sodium (VOLTAREN) 1 % GEL Apply 2 g topically 4 (four) times daily. 100 g 3  . esomeprazole (NEXIUM) 40 MG capsule Take 1 capsule (40 mg total) by mouth daily at 12 noon. 30 capsule 1  . ferrous sulfate 325 (65 FE) MG tablet Take 1 tablet (325 mg total) by mouth 3 (three) times daily with meals. 30 tablet 3  . fluocinonide cream (LIDEX) 0.05 % Apply 1 application topically 2 (two) times daily. 30 g 0  . meloxicam (MOBIC) 15 MG tablet Take 1 tablet (15 mg total) by mouth daily. 30 tablet 6  . VENTOLIN HFA 108 (90 BASE) MCG/ACT inhaler INHALE 2 PUFFS INTO THE LUNGS EVERY 6 HOURS AS NEEDED FOR  WHEEZING OR SHORTNESS OF BREATH 18 g 0  . VESICARE 5 MG tablet TAKE 1 TABLET BY MOUTH EVERY DAY 30 tablet 0   No current facility-administered medications for this visit.   Family History  Problem Relation Age of Onset  . Asthma Sister   . Asthma Brother   . Asthma Maternal Grandmother   . Asthma Maternal Grandfather   . Prostate cancer Brother   . Colon cancer Brother 67    Diet about 10 years before. Was diagnosed in late 30s.  . Throat cancer Paternal Uncle    Social History   Social History  . Marital Status: Single    Spouse Name: N/A  . Number of Children: N/A  . Years of Education: N/A   Social History Main Topics  . Smoking status: Never Smoker   . Smokeless tobacco: Never Used  . Alcohol Use: No  . Drug Use: No  . Sexual Activity: Yes    Birth Control/ Protection: Surgical   Other Topics Concern  . None   Social History Narrative   She is married and has 5 kids. 3 of them has asthma.   3 boys and 2 girls. Oldest one is 43 year old and youngest 43 years old.      Moved to  [place] from [place] in January 2014.    Review of Systems: Review of Systems  Constitutional: Negative for fever.  Respiratory: Positive for shortness of breath. Negative for cough and wheezing.   Cardiovascular: Positive for chest pain. Negative for palpitations, orthopnea and leg swelling.  Gastrointestinal: Negative for heartburn, nausea, vomiting and abdominal pain.  Neurological: Negative for headaches.     Objective:  Physical Exam: Filed Vitals:   05/08/15 0958  BP: 120/80  Pulse: 90  Temp: 100 F (37.8 C)  TempSrc: Oral  Weight: 319 lb 14.4 oz (145.106 kg)  SpO2: 100%  Physical Exam  Constitutional: She is well-developed, well-nourished, and in no distress.  Cardiovascular: Normal rate and regular rhythm.  Exam reveals no friction rub.   Pulmonary/Chest: Effort normal and breath sounds normal. She exhibits tenderness (she has pinpoint tenderness of the condral  sternal margin on R>L, she also has paraspinal tendness of the upper thoracic area bilaterally).    Abdominal: Soft. Bowel sounds are normal. There is no tenderness.  Musculoskeletal: She exhibits no edema.  Nursing note and vitals reviewed.   Assessment & Plan:  Case discussed with Dr. Cyndie Chime  Intercostal pain A: Chest pain, intercostal pain but need to rule out pericarditits  P: Obtain EKG to rule out pericarditis >>> EKG shows NSR, no ST elevation or T wave changes, overall unchanged from previous. - Suspect chostochondritis by physical exam, continue supportive care with NSAID, will add Flexeril  TID PRN.  Dysphagia A: Dysphagia, improved  P: Recommended she take a trial of the PPI especially if the dysphagia returns or she develops heartburn.  I feel her pain is less likely from untreated GERD.    Medications Ordered Meds ordered this encounter  Medications  . cyclobenzaprine (FLEXERIL) 10 MG tablet    Sig: Take 1 tablet (10 mg total) by mouth every 8 (eight) hours as needed for muscle spasms.    Dispense:  30 tablet    Refill:  1   Other Orders Orders Placed This Encounter  Procedures  . EKG 12-Lead   Follow Up: Return in about 3 months (around 08/05/2015), or if symptoms worsen or fail to improve.

## 2015-05-08 NOTE — Assessment & Plan Note (Addendum)
A: Chest pain, intercostal pain but need to rule out pericarditits  P: Obtain EKG to rule out pericarditis >>> EKG shows NSR, no ST elevation or T wave changes, overall unchanged from previous. - Suspect chostochondritis by physical exam, continue supportive care with NSAID, will add Flexeril  TID PRN.

## 2015-05-09 ENCOUNTER — Other Ambulatory Visit: Payer: Self-pay | Admitting: Internal Medicine

## 2015-05-10 ENCOUNTER — Other Ambulatory Visit: Payer: Self-pay | Admitting: Internal Medicine

## 2015-06-06 ENCOUNTER — Other Ambulatory Visit: Payer: Self-pay | Admitting: Internal Medicine

## 2015-06-06 ENCOUNTER — Other Ambulatory Visit: Payer: Self-pay | Admitting: Pulmonary Disease

## 2015-08-02 IMAGING — CR DG LUMBAR SPINE COMPLETE 4+V
6 series · 6 of 6 positions shown · non-contrast
Comparison: None.

CLINICAL DATA: Low back pain

EXAM:
LUMBAR SPINE - COMPLETE 4+ VIEW

[t l-spine a.p.]
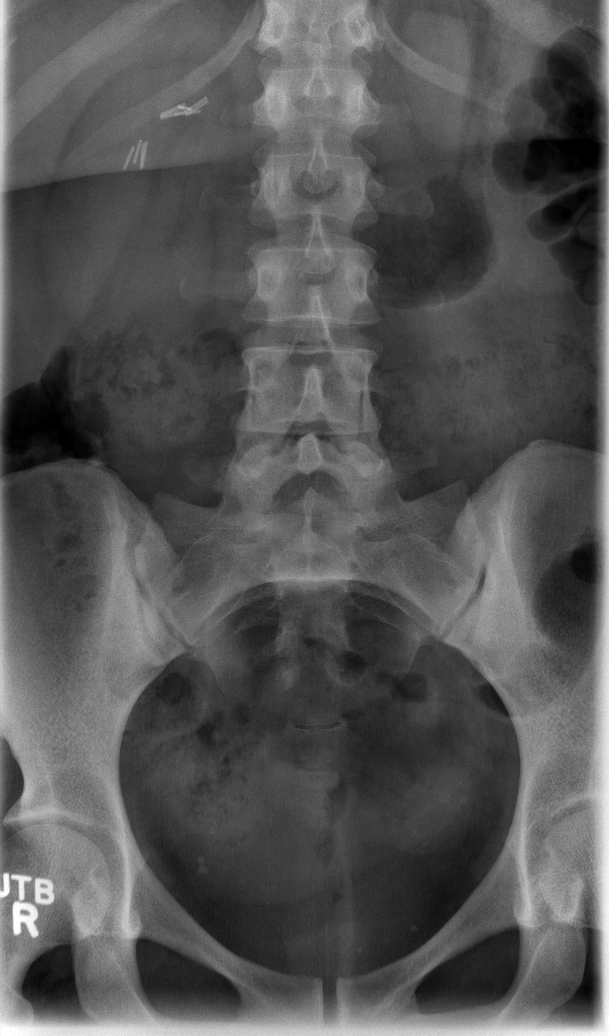

[t l-spine oblique exposure (1 of 3)]
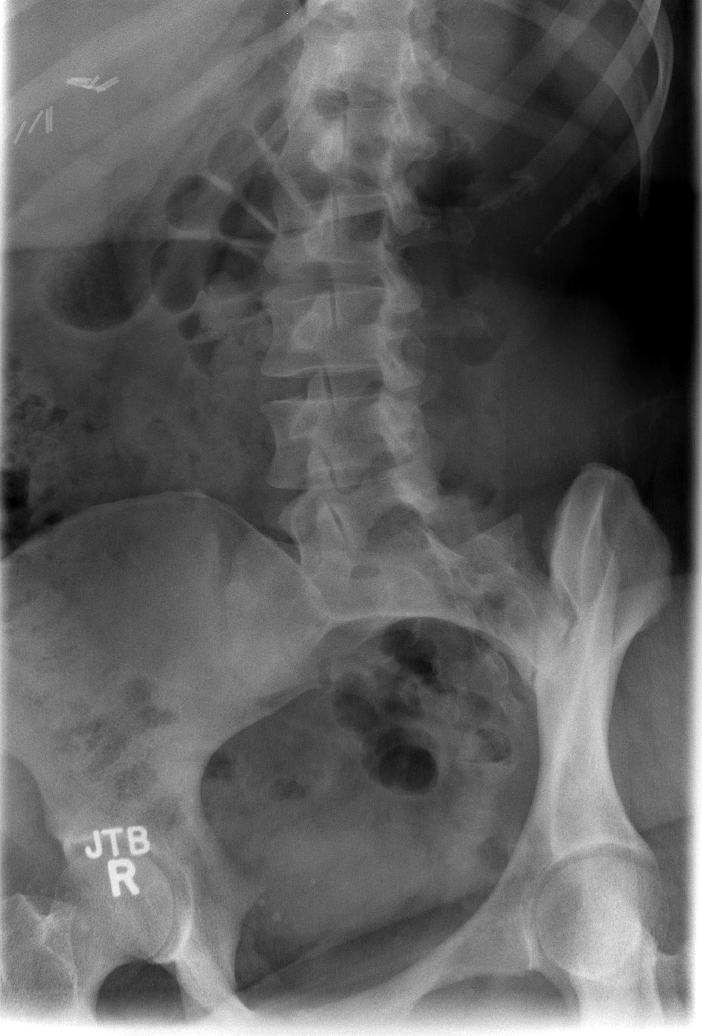

[t l-spine oblique exposure (2 of 3)]
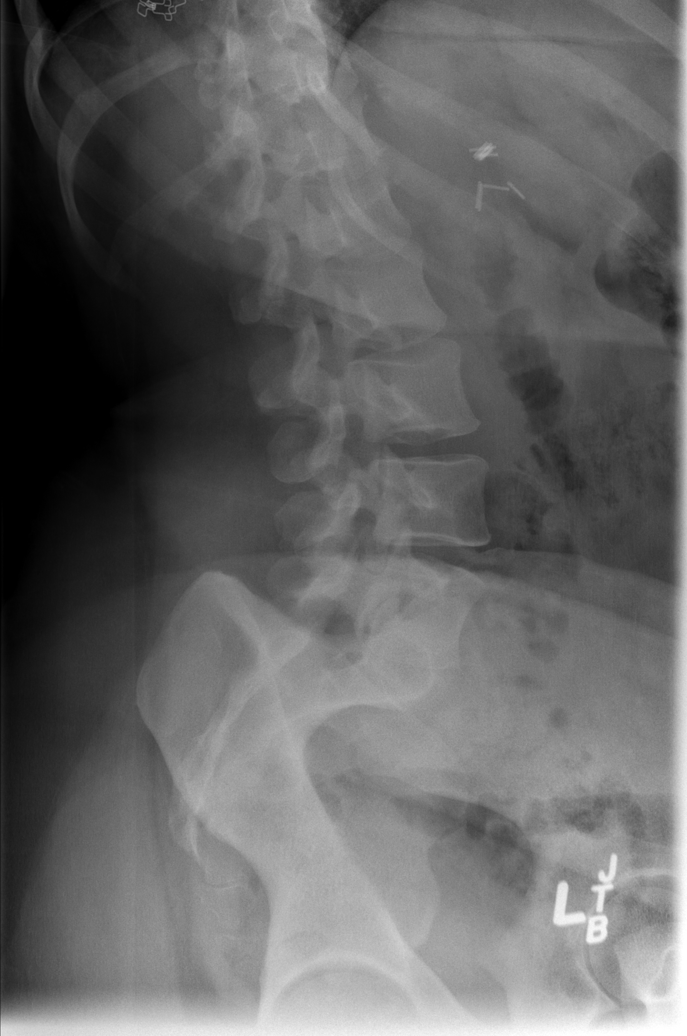

[t l-spine oblique exposure (3 of 3)]
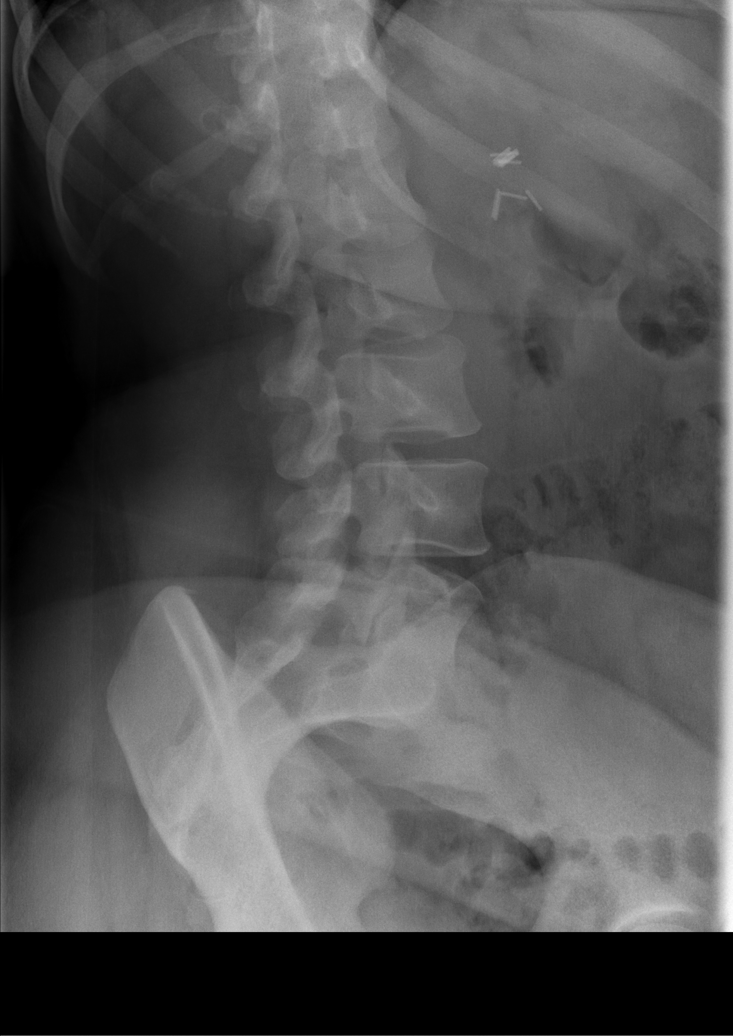

[t l-spine lat]
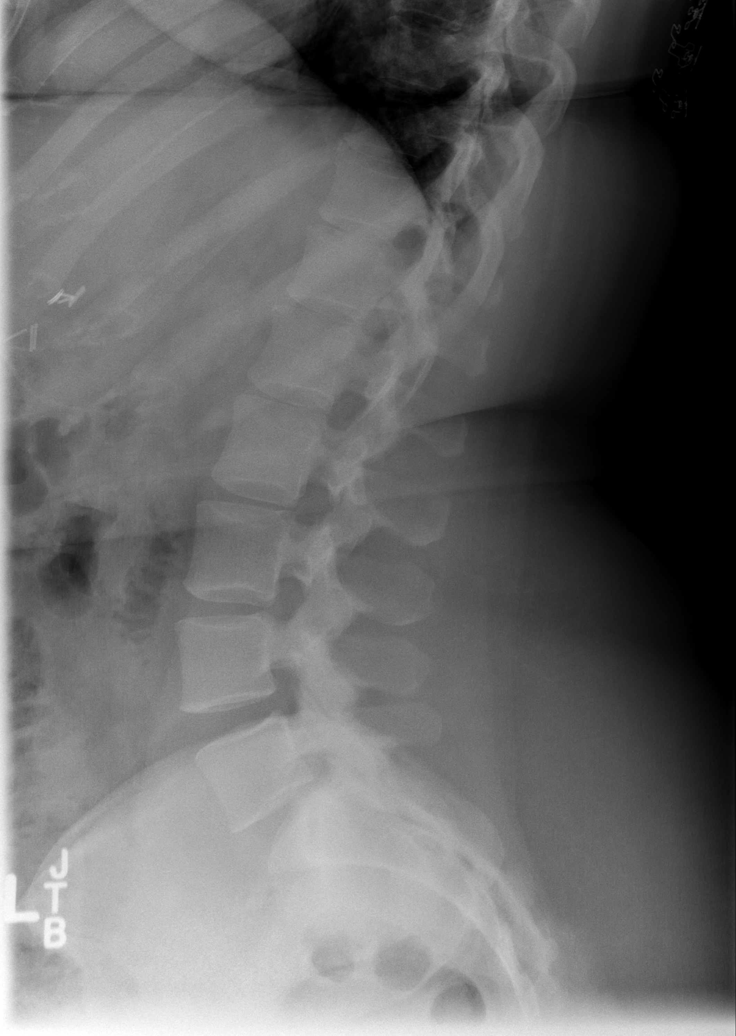

[t l-spine l5-s1 spot]
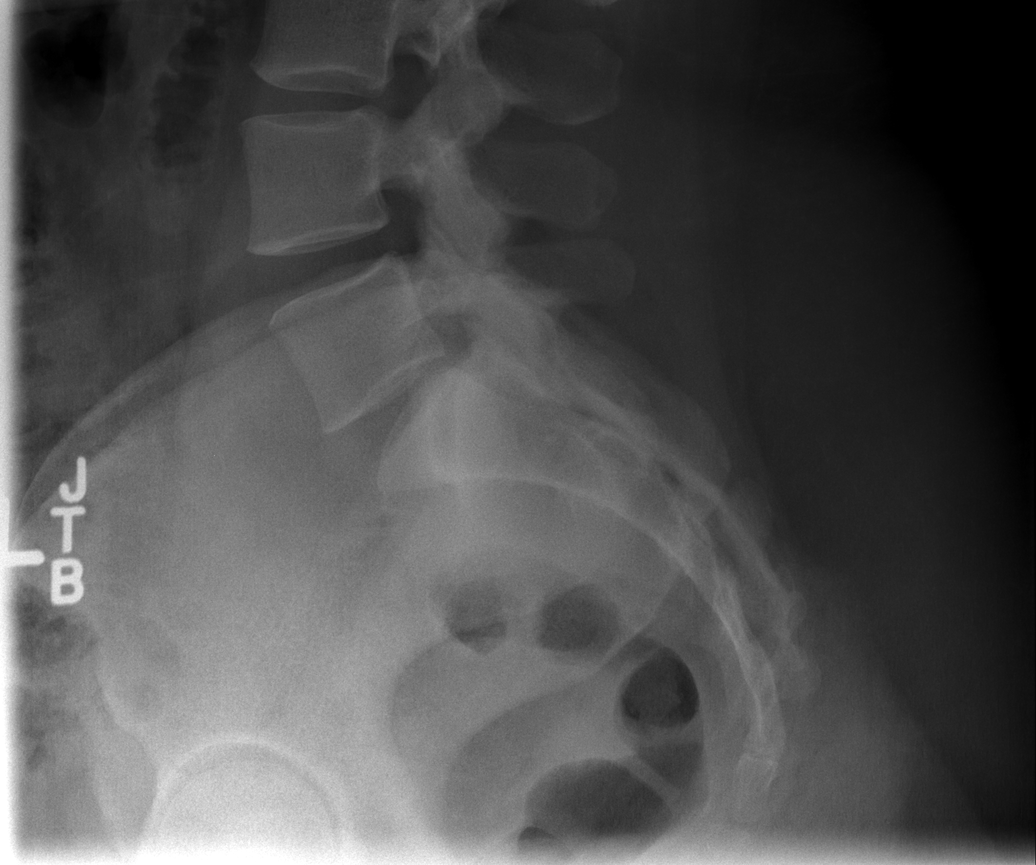

[6 of 6 positions shown; findings below may reference images not displayed]

FINDINGS: There is no evidence of lumbar spine fracture. Alignment is normal.
Intervertebral disc spaces are maintained.
IMPRESSION: Negative.

## 2015-08-27 ENCOUNTER — Encounter: Payer: Self-pay | Admitting: Internal Medicine

## 2015-08-27 ENCOUNTER — Ambulatory Visit (INDEPENDENT_AMBULATORY_CARE_PROVIDER_SITE_OTHER): Payer: Medicare Other | Admitting: Internal Medicine

## 2015-08-27 VITALS — BP 121/68 | HR 64 | Temp 98.1°F | Wt 320.0 lb

## 2015-08-27 DIAGNOSIS — M545 Low back pain: Secondary | ICD-10-CM | POA: Diagnosis present

## 2015-08-27 DIAGNOSIS — M549 Dorsalgia, unspecified: Secondary | ICD-10-CM | POA: Insufficient documentation

## 2015-08-27 MED ORDER — CYCLOBENZAPRINE HCL 10 MG PO TABS: 10.0000 mg | ORAL_TABLET | Freq: Three times a day (TID) | ORAL | Status: DC | PRN

## 2015-08-27 MED ORDER — IBUPROFEN 600 MG PO TABS: 600.0000 mg | ORAL_TABLET | Freq: Four times a day (QID) | ORAL | Status: DC

## 2015-08-27 NOTE — Progress Notes (Signed)
   Subjective:    Patient ID: [name] SessionAisha [name], female    DOB: May 01, 1970, 45 y.o.   MRN: [id number]  HPI  45 yo female with morbid obesity, chronic asthma, presents with acute back and hip pain complaint.   Started 5 days ago when you were getting out of car, no heavy lifting, no injuries. Had intermittent back pain before but was mil in the past. Has pain on left lower back, with some radiation to the left buttock, no radiation to thel legs. When sitting down the pain is zero,  moving and standing hurts 8/10. Pain is "like someone hitting on my back". Tried motrin 400mg  x4 daily. Tried soaking in hot water. Has not tried heating pad.   No fever, chills, no bowel or bladder weakness, no weakness.   Had xray Lspine 07/2013 which was normal.   Review of Systems  Constitutional: Negative for fever and chills.  HENT: Negative for congestion and sore throat.   Respiratory: Negative for cough and shortness of breath.   Cardiovascular: Negative for chest pain, palpitations and leg swelling.  Gastrointestinal: Negative for abdominal pain and abdominal distention.  Musculoskeletal: Positive for back pain. Negative for joint swelling and gait problem.       Objective:   Physical Exam  Constitutional: She is oriented to person, place, and time. She appears well-developed and well-nourished. No distress.  Obese female  HENT:  Head: Normocephalic and atraumatic.  Eyes: Conjunctivae are normal. Right eye exhibits no discharge. Left eye exhibits no discharge. No scleral icterus.  Neck: Normal range of motion.  Cardiovascular: Normal rate and regular rhythm.  Exam reveals no gallop and no friction rub.   No murmur heard. Pulmonary/Chest:  Distant breathe sounds due to body habitus  Musculoskeletal: Normal range of motion. She exhibits no edema or tenderness.  Negative leg raise test (straight and cross). Has tenderness over the left lower back muscles and spine.  Normal strength and sensation on  both legs.   Neurological: She is alert and oriented to person, place, and time.  Skin: She is not diaphoretic.     Filed Vitals:   08/27/15 0835  BP: 121/68  Pulse: 64  Temp: 98.1 F (36.7 C)        Assessment & Plan:  See problem based a&p.

## 2015-08-27 NOTE — Assessment & Plan Note (Signed)
Acute back pain without any alarming signs or symptoms. Has tenderness over LLB muscles.   Will do 1 week of scheduled ibuprofen + prn flexeril + ice/heat.  Will RTC as needed if not improving.

## 2015-08-27 NOTE — Patient Instructions (Signed)
Please take ibuprofen every 6 hours scheduled for 1 week.  Take flexeril as needed.  Apply ice or heat to the back.  Come back and see us if not feeling better.

## 2015-08-29 NOTE — Progress Notes (Signed)
Internal Medicine Clinic Attending  Case discussed with Dr. Ahmed at the time of the visit.  We reviewed the resident's history and exam and pertinent patient test results.  I agree with the assessment, diagnosis, and plan of care documented in the resident's note. 

## 2015-08-31 ENCOUNTER — Other Ambulatory Visit: Payer: Self-pay | Admitting: Internal Medicine

## 2015-09-16 ENCOUNTER — Other Ambulatory Visit: Payer: Self-pay | Admitting: Internal Medicine

## 2015-10-03 ENCOUNTER — Other Ambulatory Visit: Payer: Self-pay | Admitting: Internal Medicine

## 2015-10-04 ENCOUNTER — Ambulatory Visit (INDEPENDENT_AMBULATORY_CARE_PROVIDER_SITE_OTHER): Payer: Medicare Other | Admitting: Internal Medicine

## 2015-10-04 ENCOUNTER — Encounter: Payer: Self-pay | Admitting: Internal Medicine

## 2015-10-04 VITALS — BP 127/95 | HR 64 | Temp 98.4°F | Ht 65.0 in | Wt 318.3 lb

## 2015-10-04 DIAGNOSIS — M19031 Primary osteoarthritis, right wrist: Secondary | ICD-10-CM | POA: Diagnosis not present

## 2015-10-04 DIAGNOSIS — K219 Gastro-esophageal reflux disease without esophagitis: Secondary | ICD-10-CM | POA: Diagnosis not present

## 2015-10-04 DIAGNOSIS — M1711 Unilateral primary osteoarthritis, right knee: Secondary | ICD-10-CM | POA: Diagnosis not present

## 2015-10-04 DIAGNOSIS — N3941 Urge incontinence: Secondary | ICD-10-CM

## 2015-10-04 DIAGNOSIS — M1990 Unspecified osteoarthritis, unspecified site: Secondary | ICD-10-CM

## 2015-10-04 MED ORDER — FLUOCINONIDE 0.05 % EX CREA: 1.0000 "application " | TOPICAL_CREAM | Freq: Two times a day (BID) | CUTANEOUS | Status: DC

## 2015-10-04 MED ORDER — ESOMEPRAZOLE MAGNESIUM 40 MG PO CPDR
40.0000 mg | DELAYED_RELEASE_CAPSULE | Freq: Every day | ORAL | Status: DC
Start: 2015-10-04 — End: 2015-12-08

## 2015-10-04 MED ORDER — SOLIFENACIN SUCCINATE 5 MG PO TABS: 5.0000 mg | ORAL_TABLET | Freq: Every day | ORAL | Status: DC

## 2015-10-04 NOTE — Progress Notes (Signed)
   CC: Right wrist and knee pain  HPI:  [name] is a 45 y.o. female with PMHx detailed below presenting with worsening of her chronic arthritis pain particularly of the right wrist and knee for the past 3 weeks. This pain is worst with walking up stairs on her knee. She is not sure what causes the wrist pain to get worse. There is no numbness, weakness, or obvious swelling associated with the pain. She has been taking oral NSAIDs for this and sometimes soaking in hot water with mild improvement in her pain. She has not much progress towards weight loss or exercising regularly.  See problem based assessment and plan below for additional details.  Past Medical History  Diagnosis Date  . Asthma   . Arthritis   . Morbid obesity with BMI of 45.0-49.9, adult (HCC) 05/15/2013    Review of Systems: Review of Systems  Cardiovascular: Negative for leg swelling.  Musculoskeletal: Positive for back pain, joint pain and neck pain. Negative for falls.  Skin: Negative for rash.  Neurological: Negative for sensory change and headaches.     Physical Exam: Filed Vitals:   10/04/15 1434  BP: 127/95  Pulse: 64  Temp: 98.4 F (36.9 C)  TempSrc: Oral  Height: 5\' 5"  (1.651 m)  Weight: 318 lb 4.8 oz (144.38 kg)  SpO2: 100%   GENERAL- co-operative, morbidly obese woman in NAD CARDIAC- RRR, distant heart sounds RESP- CTAB, no wheezes or crackles BACK- Mild paraspinal tenderness EXTREMITIES- symmetric, knees are somewhat TTP bilaterally, without obvious erythema or effusion, minimal crepitus on ROM, no laxity, right wrist no pain is reproducible on exam SKIN- Warm, dry, No rash or lesion   Filed Vitals:   10/04/15 1434  BP: 127/95  Pulse: 64  Temp: 98.4 F (36.9 C)  TempSrc: Oral  Height: 5\' 5"  (1.651 m)  Weight: 318 lb 4.8 oz (144.38 kg)  SpO2: 100%     Assessment & Plan:   See encounters tab for problem based medical decision making.   Patient discussed with Dr.  Cyndie ChimeGranfortuna

## 2015-10-04 NOTE — Patient Instructions (Addendum)
It was a pleasure to see you today Cassidy [name]. I am sorry to hear you are having so much trouble with pain lately.  I think we can improve your symptoms with trying to get some physical therapy treatment and recommendations. Low impact exercise would also be really good for long term benefit to your joint pain plus can help with weight loss.  If you continue taking antiinflammatory medicines it is important to also take Nexium (esomeprazole) for your acid reflux otherwise you have increased risk of heartburn and stomach inflammation.  If your pain is really out of control and worsening within the next few weeks we could consider treatment such as a joint injection to improve symptoms temporarily. If you need us again for any questions or problems in the meantime please call or arrange another appointment sooner.

## 2015-10-05 NOTE — Assessment & Plan Note (Signed)
Assessment: Her dysphagia seems mixed with throat pain in the mornings, and heartburn. This seems like untreated GERD since she is noncompliant with her PPI and is taking NSAIDs every day for months. I counseled her on the importance of actually taking this medicine.  Plan: -Refill Nexium 40mg 

## 2015-10-05 NOTE — Assessment & Plan Note (Signed)
Assessment: She continues to have significant pain and is very frustrated. She has not made any progress with weight loss which really seems to be her underlying problem. She has been tested for inflammatory arthritis and had negative serologies. She does not want to pursue more invasive treatment such as intraarticular injections at this time. She has not been formally seen with physical therapy and there may be some other recommendations they could offer.  Plan: Continue oral NSAIDs Information provided for [facility] program Referral to physical therapy Referral to PT

## 2015-10-07 ENCOUNTER — Encounter: Payer: Self-pay | Admitting: *Deleted

## 2015-10-07 NOTE — Progress Notes (Signed)
Medicine attending: Medical history, presenting problems, physical findings, and medications, reviewed with resident physician Dr Christopher Rice on the day of the patient visit and I concur with his evaluation and management plan. 

## 2015-10-10 ENCOUNTER — Ambulatory Visit: Payer: Medicare Other | Attending: Internal Medicine | Admitting: Physical Therapy

## 2015-10-10 ENCOUNTER — Encounter: Payer: Self-pay | Admitting: Physical Therapy

## 2015-10-10 DIAGNOSIS — M25561 Pain in right knee: Secondary | ICD-10-CM | POA: Insufficient documentation

## 2015-10-10 DIAGNOSIS — M25661 Stiffness of right knee, not elsewhere classified: Secondary | ICD-10-CM | POA: Diagnosis not present

## 2015-10-10 DIAGNOSIS — M25562 Pain in left knee: Secondary | ICD-10-CM | POA: Insufficient documentation

## 2015-10-10 DIAGNOSIS — M6281 Muscle weakness (generalized): Secondary | ICD-10-CM | POA: Diagnosis not present

## 2015-10-10 DIAGNOSIS — R2689 Other abnormalities of gait and mobility: Secondary | ICD-10-CM | POA: Diagnosis not present

## 2015-10-10 NOTE — Therapy (Signed)
[facility] [address] [place], [place], [address] Phone: [phone]   Fax:  [phone]  Physical Therapy Evaluation  Patient Details  Name: Cassidy [name] MRN: [id number] Date of Birth: May 15, 1970 Referring Provider: Tyson Alias MD  Encounter Date: 10/10/2015      PT End of Session - 10/10/15 1325    Visit Number 1   Number of Visits 13   Date for PT Re-Evaluation 11/21/15   Authorization Type Medicare: kx mod by 15th visit, progress note by 10th visit   PT Start Time 1110  pt arrived 10 minutes late   PT Stop Time 1146   PT Time Calculation (min) 36 min   Activity Tolerance Patient tolerated treatment well;Patient limited by pain   Behavior During Therapy [facility] for tasks assessed/performed      Past Medical History:  Diagnosis Date  . Arthritis   . Asthma   . Morbid obesity with BMI of 45.0-49.9, adult (HCC) 05/15/2013    Past Surgical History:  Procedure Laterality Date  . CHOLECYSTECTOMY  1998  . RECTAL PROLAPSE REPAIR, RECTOPEXY  2007   Uterine and rectal prolapse repair.  . TUBAL LIGATION Bilateral 2005    There were no vitals filed for this visit.       Subjective Assessment - 10/10/15 1117    Subjective pt is 45 y.o F with CC of arthritis in mulitple joints but mostly in the knees. Knee pain started 5 years ago with gradual insidous onset. pain stays in the knee which occiasonal back/ hip pain.  pain seems to fluctate across the body and vary based on activity.  knee pain R >L with popping / clicking and grinding in the knees feeling unstable   Limitations Sitting;Lifting;Standing;Walking;House hold activities   How long can you sit comfortably? 5 min    How long can you stand comfortably? 10-15 min   How long can you walk comfortably? 10-15 min   Diagnostic tests No recent imaging   Patient Stated Goals to be 1/2 way comfortable, be able to do ADLS without having to sit and rest, improve  endurnace with walking/ standing and sitting, decrease pain   Currently in Pain? Yes   Pain Score 7    Pain Location Knee   Pain Orientation Right;Left;Posterior;Medial   Pain Descriptors / Indicators Aching;Sore   Pain Type Chronic pain   Pain Radiating Towards radiates to bil thighs and possibly to the back   Pain Onset More than a month ago   Pain Frequency Constant   Aggravating Factors  bending, stairs, walking/ sitting,    Pain Relieving Factors heat, pain medication, sleeing   Effect of Pain on Daily Activities limited endurance with multiple postions, weakness,             [facility] PT Assessment - 10/10/15 1124      Assessment   Medical Diagnosis bil knee pain  arthritis dx   Referring Provider Tyson Alias MD   Onset Date/Surgical Date --  5 years   Hand Dominance Right   Next MD Visit --  next week   Prior Therapy no     Precautions   Precautions None     Restrictions   Weight Bearing Restrictions No     Balance Screen   Has the patient fallen in the past 6 months No   Has the patient had a decrease in activity level because of a fear of falling?  No   Is the patient  reluctant to leave their home because of a fear of falling?  No     Home Environment   Living Environment Private residence   Living Arrangements Spouse/significant other   Available Help at Discharge Available PRN/intermittently   Type of Home Apartment   Home Access Stairs to enter   Entrance Stairs-Number of Steps 4   Entrance Stairs-Rails None   Home Layout Two level   Alternate Level Stairs-Number of Steps 14   Alternate Level Stairs-Rails Right     Prior Function   Level of Independence Independent;Independent with basic ADLs   Vocation Catering manager prolong sitting   Leisure baking      Cognition   Overall Cognitive Status Within Functional Limits for tasks assessed     Posture/Postural Control   Posture/Postural Control Postural  limitations   Postural Limitations Rounded Shoulders;Forward head     ROM / Strength   AROM / PROM / Strength AROM;PROM;Strength     AROM   AROM Assessment Site Knee   Right/Left Knee Right;Left   Right Knee Extension -4   Right Knee Flexion 105   Left Knee Extension -4   Left Knee Flexion 112     PROM   PROM Assessment Site Knee   Right/Left Knee Right;Left   Right Knee Extension 0   Right Knee Flexion 112   Left Knee Extension 0   Left Knee Flexion 125     Strength   Strength Assessment Site Knee;Hip   Right/Left Hip Right;Left   Right Hip Flexion 3/5   Right Hip Extension 3/5   Right Hip ABduction 3+/5   Right Hip ADduction 4-/5   Left Hip Flexion 4-/5   Left Hip Extension 4-/5   Left Hip ABduction 3+/5   Left Hip ADduction 4-/5   Right/Left Knee Right;Left   Right Knee Flexion 3+/5  pain during testing   Right Knee Extension 3+/5  pain during testing   Left Knee Flexion 4/5  soreness during testing   Left Knee Extension 4/5  soreness during testing     Palpation   Patella mobility hypermobility on bil patellas    Palpation comment tenderness in the medial/ lateral joint lines bil with R>L. palpable mass in the medial aspect of the popliteal fossa with significant soreness.      Special Tests    Special Tests Meniscus Tests   Meniscus Tests McMurray Test     McMurray Test   Findings Positive   Side Right     Ambulation/Gait   Ambulation/Gait Yes   Gait Pattern Step-through pattern;Decreased stride length;Antalgic;Trendelenburg;Trunk flexed;Narrow base of support;Decreased trunk rotation                           PT Education - 10/10/15 1324    Education provided Yes   Education Details evaluation findings, goals, POC, HEP with proper form/ treatment rationale.    Person(s) Educated Patient   Methods Explanation;Demonstration;Handout;Verbal cues   Comprehension Verbalized understanding;Verbal cues required          PT Short  Term Goals - 10/10/15 1333      PT SHORT TERM GOAL #1   Title pt will be I with inital HEP (10/31/2015)   Time 3   Period Weeks   Status New     PT SHORT TERM GOAL #2   Title pt will be able to verbalize/ demo techniques to reduce bil pain and inflammation  via RICE and HEP (10/31/2015)   Time 3   Period Weeks   Status New     PT SHORT TERM GOAL #3   Title pt will be able to sit / stand/ walk for >/= 10 min with </= 5/10 pain to promote functional progression (10/31/2015)   Time 3   Period Weeks   Status New           PT Long Term Goals - 10/10/15 1334      PT LONG TERM GOAL #1   Title pt will be I with all advanced HEP given throughtout therapy (11/21/2015)   Time 6   Period Weeks   Status New     PT LONG TERM GOAL #2   Title pt will improve R knee arom to >/= 110 degrees of flexion and extension to 0 degrees with </= 3/10 painto assist with function and efficent gait pattern (11/21/2015)   Time 6   Period Weeks   Status New     PT LONG TERM GOAL #3   Title pt will improve bil LE strength to >/= 4/5 with </= 3/10 pain to assist with functional mobility and safety during walking/ standing (11/21/2015)   Time 6   Period Weeks   Status New     PT LONG TERM GOAL #4   Title pt will be able to sit/ stand/ walk >/= 20 min and navigate up/ down >/= 10 steps with 1 HHA using railing assist with </= 3/10 pain to promote funtional endurance required for school and ADLs (11/21/2015)   Time 6   Period Weeks   Status New     PT LONG TERM GOAL #5   Title Increase FOTO score to </= 46% limited to demonstrate improvement in function (11/21/2015)   Time 6   Period Weeks   Status New               Plan - 10/10/15 1326    Clinical Impression Statement Mrs. Starkes presents to OPPT as a low complexity evaluation with CC of bil knee pain with R>L. she demonstrates limited knee AROM/ PROM with pain at end ranges bil. weakness in bil LE's with pain during assessment. pain upon palpation  at the joint line bil with palpable painfull nodule in the posterior medial popliteal space. with report of popping/ clicking and giving away in combination with limited knee mobility, and positive mcmurrays testing she may have possible mensical involvement in combination with dx of arthritis. She would benefit from phyiscal therapy to decrease bil knee pain, improve ROM, increase standing/ walking tolerance, and overall maximize her function by addressing the impairments listed.  Discussed with pt if no progress is seen within the first 8-12 visits then may have to refer back to MD for further assessment which pt agreed.    Rehab Potential Good   PT Frequency 2x / week   PT Duration 6 weeks   PT Treatment/Interventions ADLs/Self Care Home Management;Cryotherapy;Electrical Stimulation;Iontophoresis 4mg /ml Dexamethasone;Passive range of motion;Vasopneumatic Device;Taping;Dry needling;Therapeutic activities;Therapeutic exercise;Moist Heat;Manual techniques   PT Next Visit Plan assess/ review HEP, hip/ knee strengthening, ice or vaso for pain.    PT Home Exercise Plan quad sets, heel slides with strap, bridge or glute set, clams   Consulted and Agree with Plan of Care Patient      Patient will benefit from skilled therapeutic intervention in order to improve the following deficits and impairments:  Abnormal gait, Decreased activity tolerance, Decreased endurance, Decreased balance, Pain, Improper  body mechanics, Postural dysfunction, Increased fascial restricitons, Difficulty walking, Decreased range of motion, Hypomobility, Increased edema, Decreased strength  Visit Diagnosis: Pain in right knee - Plan: PT plan of care cert/re-cert  Stiffness of right knee, not elsewhere classified - Plan: PT plan of care cert/re-cert  Pain in left knee - Plan: PT plan of care cert/re-cert  Muscle weakness (generalized) - Plan: PT plan of care cert/re-cert  Other abnormalities of gait and mobility - Plan: PT  plan of care cert/re-cert      G-Codes - 14-Oct-2015 1341    Functional Limitation Mobility: Walking and moving around   Mobility: Walking and Moving Around Current Status [phone]) At least 60 percent but less than 80 percent impaired, limited or restricted   Mobility: Walking and Moving Around Goal Status [phone]) At least 40 percent but less than 60 percent impaired, limited or restricted       Problem List Patient Active Problem List   Diagnosis Date Noted  . Acute back pain 08/27/2015  . Intercostal pain 05/08/2015  . GERD (gastroesophageal reflux disease) 04/03/2015  . Internal hemorrhoid 09/03/2014  . Urge incontinence 12/13/2013  . Tension headache 09/25/2013  . Morbid obesity with BMI of 45.0-49.9, adult (HCC) 05/15/2013  . Vitamin D deficiency 08/05/2012  . Asthma, chronic 07/28/2012  . Arthritis 07/28/2012  . Health maintenance examination 07/28/2012   Cassidy [name] PT, DPT, LAT, ATC  10-14-15  1:47 PM      [facility] Outpatient Rehabilitation [facility] [address] [place], [place], [address] Phone: [phone]   Fax:  [phone]  Name: Cassidy [name] MRN: [id number] Date of Birth: March 16, 1971

## 2015-10-14 ENCOUNTER — Telehealth: Payer: Self-pay | Admitting: Dietician

## 2015-10-14 NOTE — Telephone Encounter (Signed)
Thanks so much Cassidy [name]! Sorry about the mistaken recommendation.

## 2015-10-14 NOTE — Telephone Encounter (Signed)
Ainara left a message asking for an activation code for the Silver Sneakers program at the [facility].  Tried to sign her up and realized she is not covered by a Medicare company who offers Silver [employer].  Told her about the [employer] scholarship and the [employer] exercise programs that make physical activity very affordable.   I mailed her information on both with my contact information for questions.

## 2015-10-18 ENCOUNTER — Ambulatory Visit (INDEPENDENT_AMBULATORY_CARE_PROVIDER_SITE_OTHER): Payer: Medicare Other | Admitting: Internal Medicine

## 2015-10-18 ENCOUNTER — Encounter: Payer: Self-pay | Admitting: Internal Medicine

## 2015-10-18 ENCOUNTER — Encounter: Payer: Medicare Other | Admitting: Internal Medicine

## 2015-10-18 VITALS — BP 105/87 | HR 70 | Temp 98.4°F | Ht 66.0 in | Wt 315.9 lb

## 2015-10-18 DIAGNOSIS — M1990 Unspecified osteoarthritis, unspecified site: Secondary | ICD-10-CM

## 2015-10-18 DIAGNOSIS — M170 Bilateral primary osteoarthritis of knee: Secondary | ICD-10-CM

## 2015-10-18 DIAGNOSIS — K219 Gastro-esophageal reflux disease without esophagitis: Secondary | ICD-10-CM | POA: Diagnosis not present

## 2015-10-18 NOTE — Patient Instructions (Signed)
It is a pleasure to see you again today Cassidy [name]. I am glad you were able to get started with physical therapy since this is the most beneficial treatment for your pain. It will be tough but try to stay motivated knowing the more you can accomplish the less your pain will be afterwards.  You should take the nexium daily for now especially while taking lots of meloxicam/ibuprofen to relieve symptoms of acid reflux and protect from inflammation of the esophagus that is probably the cause of your trouble swallowing.

## 2015-10-21 ENCOUNTER — Encounter: Payer: Medicare Other | Admitting: Physical Therapy

## 2015-10-21 NOTE — Assessment & Plan Note (Signed)
Continues to not take nexium as directed, continues to have indigestion and intermittent dysphagia complaints. I recommended her to do so. She continues taking daily NSAIDs.

## 2015-10-21 NOTE — Progress Notes (Signed)
   CC: Follow up visit for chronic knee pain  HPI:  [name] is a 45 y.o. woman with a history of bilateral knee arthritis who was was seen for increased pain last month and started working with physical therapy. She has seen them one time with initial exercise recommendations including a resistance band. So far she has not made much independent effort to follow these recommendations. Her pain is improved today compared to last visit. See problem based assessment and plan below for additional details.  Past Medical History:  Diagnosis Date  . Arthritis   . Asthma   . Morbid obesity with BMI of 45.0-49.9, adult (HCC) 05/15/2013    Review of Systems:  Review of Systems  Constitutional: Negative for weight loss.  Cardiovascular: Positive for leg swelling.  Musculoskeletal: Positive for back pain and joint pain. Negative for falls.  Neurological: Negative for dizziness and focal weakness.    Physical Exam:  Vitals:   10/18/15 1539  BP: 105/87  Pulse: 70  Temp: 98.4 F (36.9 C)  TempSrc: Oral  SpO2: 100%  Weight: (!) 315 lb 14.4 oz (143.3 kg)  Height: 5\' 6"  (1.676 m)   GENERAL- co-operative, morbidly obese woman in NAD CARDIAC- RRR, distant heart sounds RESP- CTAB, no wheezes or crackles BACK- Mild paraspinal tenderness EXTREMITIES- symmetric, knees are somewhat TTP bilaterally, without obvious erythema or effusion, minimal crepitus on ROM, pain particularly of right knee with active ROM against resistance SKIN- Warm, dry, No rash or lesion   Assessment & Plan:   See Encounters Tab for problem based charting.  Patient discussed with Dr. Cyndie ChimeGranfortuna

## 2015-10-21 NOTE — Progress Notes (Signed)
Medicine attending: Medical history, presenting problems, physical findings, and medications, reviewed with resident physician Dr Christopher Rice on the day of the patient visit and I concur with his evaluation and management plan. 

## 2015-10-21 NOTE — Assessment & Plan Note (Signed)
Assessment: Personally reviewed PT evaluation and recommendations today. She was noted to have significant muscle weakness and and some joint laxity as well as her pain. Meniscal involvement is possible but she denies any history of trauma to the knees. Meniscal injury from degenerative joint disease is not generally a good surgical option so I don't think more extensive imaging is really needed at this time.  She has not done much on her own yet and will hopefully benefit a lot from PT, as well as if she can lose any weight. We will need to see in a few weeks if she is getting any benefit.  Plan: Continue NSAIDs, PT Follow up at approximately 8 weeks

## 2015-10-23 ENCOUNTER — Encounter: Payer: Medicare Other | Admitting: Physical Therapy

## 2015-10-28 ENCOUNTER — Ambulatory Visit: Payer: Medicare Other | Attending: Internal Medicine | Admitting: Physical Therapy

## 2015-10-28 DIAGNOSIS — M25562 Pain in left knee: Secondary | ICD-10-CM | POA: Insufficient documentation

## 2015-10-28 DIAGNOSIS — M6281 Muscle weakness (generalized): Secondary | ICD-10-CM | POA: Diagnosis not present

## 2015-10-28 DIAGNOSIS — R2689 Other abnormalities of gait and mobility: Secondary | ICD-10-CM | POA: Insufficient documentation

## 2015-10-28 DIAGNOSIS — M25661 Stiffness of right knee, not elsewhere classified: Secondary | ICD-10-CM | POA: Insufficient documentation

## 2015-10-28 DIAGNOSIS — M25561 Pain in right knee: Secondary | ICD-10-CM | POA: Insufficient documentation

## 2015-10-28 NOTE — Therapy (Signed)
[place], [place], [address] Phone: [phone]   Fax:  [phone]  Physical Therapy Treatment  Patient Details  Name: Cassidy [name] MRN: [id number] Date of Birth: Dec 14, 1970 Referring Provider: Axel Filler MD  Encounter Date: 10/28/2015      PT End of Session - 10/28/15 1515    Visit Number 2   Number of Visits 13   Date for PT Re-Evaluation 11/21/15   PT Start Time 3833   PT Stop Time 1416   PT Time Calculation (min) 42 min   Activity Tolerance Patient tolerated treatment well;Patient limited by pain   Behavior During Therapy [facility] for tasks assessed/performed      Past Medical History:  Diagnosis Date  . Arthritis   . Asthma   . Morbid obesity with BMI of 45.0-49.9, adult ([place]) 05/15/2013    Past Surgical History:  Procedure Laterality Date  . CHOLECYSTECTOMY  1998  . RECTAL PROLAPSE REPAIR, RECTOPEXY  2007   Uterine and rectal prolapse repair.  . TUBAL LIGATION Bilateral 2005    There were no vitals filed for this visit.      Subjective Assessment - 10/28/15 1344    Subjective Pain is about the same as on eval.  Has not been doing her her exercises.  Bands hurt.  Lost her papers.  Saw MD.  Not ready for MRI.  He wants to wait 2 months.    Currently in Pain? Yes   Pain Score 5    Pain Orientation Right;Left   Pain Descriptors / Indicators Aching   Pain Frequency Constant   Aggravating Factors  bending ,  stairs, sitting   Pain Relieving Factors medication, change of position, heat,  sit in hot water,  heating pad.     Effect of Pain on Daily Activities wakes her 3-4 X a night                         [facility] Adult PT Treatment/Exercise - 10/28/15 0001      Knee/Hip Exercises: Stretches   Passive Hamstring Stretch Both  1-3 reps 30 to 10  seconds,painful.      Knee/Hip Exercises: Seated   Long Arc Quad --  3 X 5 RT painful,  5 x 10 Left 10 second holds,   AA extensio   Heel Slides 10 reps;Both  pillowcase to decrease friction.     Knee/Hip Exercises: Supine   Quad Sets 10 reps  painful   Other Supine Knee/Hip Exercises gluteal sets 10 X 5 seconds     Knee/Hip Exercises: Sidelying   Clams 10 X cues  painful,  extra time to roll     Manual Therapy   Manual therapy comments education patient and Fiance how to do retrograde soft tissue work to decrease edsema assist pain.                 PT Education - 10/28/15 1515    Education provided Yes   Education Details retrograde   Person(s) Educated Patient;Other (comment)  Fiance   Methods Explanation;Demonstration;Tactile cues;Verbal cues   Comprehension Verbalized understanding;Returned demonstration          PT Short Term Goals - 10/28/15 1530      PT SHORT TERM GOAL #1   Title pt will be I with inital HEP (10/31/2015)   Baseline cues needed   Time 3   Period Weeks   Status On-going  PT SHORT TERM GOAL #2   Title pt will be able to verbalize/ demo techniques to reduce bil pain and inflammation via RICE and HEP (10/31/2015)   Time 3   Period Weeks   Status On-going     PT SHORT TERM GOAL #3   Time 3   Period Weeks   Status Unable to assess           PT Long Term Goals - 10/10/15 1334      PT LONG TERM GOAL #1   Title pt will be I with all advanced HEP given throughtout therapy (11/21/2015)   Time 6   Period Weeks   Status New     PT LONG TERM GOAL #2   Title pt will improve R knee arom to >/= 110 degrees of flexion and extension to 0 degrees with </= 3/10 painto assist with function and efficent gait pattern (11/21/2015)   Time 6   Period Weeks   Status New     PT LONG TERM GOAL #3   Title pt will improve bil LE strength to >/= 4/5 with </= 3/10 pain to assist with functional mobility and safety during walking/ standing (11/21/2015)   Time 6   Period Weeks   Status New     PT LONG TERM GOAL #4   Title pt will be able to sit/ stand/ walk >/= 20 min  and navigate up/ down >/= 10 steps with 1 HHA using railing assist with </= 3/10 pain to promote funtional endurance required for school and ADLs (11/21/2015)   Time 6   Period Weeks   Status New     PT LONG TERM GOAL #5   Title Increase FOTO score to </= 46% limited to demonstrate improvement in function (11/21/2015)   Time 6   Period Weeks   Status New               Plan - 10/28/15 1516    Clinical Impression Statement Modified clams to sidelying due to band painful.  She has not been doing her home exercises.  Edema visabily improved with soft tissue work. (Taught fiance how)  pain increased to 7/10  with session. No new goals met.   PT Next Visit Plan try standing hip?.     PT Home Exercise Plan continue,  try clams on side   Consulted and Agree with Plan of Care Patient      Patient will benefit from skilled therapeutic intervention in order to improve the following deficits and impairments:  Abnormal gait, Decreased activity tolerance, Decreased endurance, Decreased balance, Pain, Improper body mechanics, Postural dysfunction, Increased fascial restricitons, Difficulty walking, Decreased range of motion, Hypomobility, Increased edema, Decreased strength  Visit Diagnosis: Pain in right knee  Stiffness of right knee, not elsewhere classified  Pain in left knee  Muscle weakness (generalized)  Other abnormalities of gait and mobility     Problem List Patient Active Problem List   Diagnosis Date Noted  . Intercostal pain 05/08/2015  . GERD (gastroesophageal reflux disease) 04/03/2015  . Internal hemorrhoid 09/03/2014  . Urge incontinence 12/13/2013  . Tension headache 09/25/2013  . Morbid obesity with BMI of 45.0-49.9, adult ([place]) 05/15/2013  . Vitamin D deficiency 08/05/2012  . Asthma, chronic 07/28/2012  . Arthritis 07/28/2012  . Health maintenance examination 07/28/2012    [facility] 10/28/2015, 3:32 PM  [facility] [address] [place], [place], [address] Phone: [phone]   Fax:  [phone]  Name: Cassidy [name] MRN: [id number] Date of Birth: 03-23-1970   Lloyd Cullinan, PTA 10/28/15 3:32 PM Phone: [phone] Fax: [phone]

## 2015-10-30 ENCOUNTER — Ambulatory Visit: Payer: Medicare Other | Admitting: Physical Therapy

## 2015-10-30 DIAGNOSIS — M25561 Pain in right knee: Secondary | ICD-10-CM | POA: Diagnosis not present

## 2015-10-30 DIAGNOSIS — M6281 Muscle weakness (generalized): Secondary | ICD-10-CM | POA: Diagnosis not present

## 2015-10-30 DIAGNOSIS — R2689 Other abnormalities of gait and mobility: Secondary | ICD-10-CM

## 2015-10-30 DIAGNOSIS — M25661 Stiffness of right knee, not elsewhere classified: Secondary | ICD-10-CM

## 2015-10-30 DIAGNOSIS — M25562 Pain in left knee: Secondary | ICD-10-CM

## 2015-10-30 NOTE — Therapy (Signed)
[facility] [address] [place], [place], [address] Phone: [phone]   Fax:  [phone]  Physical Therapy Treatment  Patient Details  Name: Cassidy [name] Baudoin MRN: [id number] Date of Birth: 11-Jan-1971 Referring Provider: Tyson Aliasuncan Thomas Vincent MD  Encounter Date: 10/30/2015      PT End of Session - 10/30/15 1248    Visit Number 3   Number of Visits 13   Date for PT Re-Evaluation 11/21/15   Authorization Type Medicare: kx mod by 15th visit, progress note by 10th visit   PT Start Time 1100   PT Stop Time 1144   PT Time Calculation (min) 44 min   Activity Tolerance Patient tolerated treatment well   Behavior During Therapy [facility] for tasks assessed/performed      Past Medical History:  Diagnosis Date  . Arthritis   . Asthma   . Morbid obesity with BMI of 45.0-49.9, adult ([facility]) 05/15/2013    Past Surgical History:  Procedure Laterality Date  . CHOLECYSTECTOMY  1998  . RECTAL PROLAPSE REPAIR, RECTOPEXY  2007   Uterine and rectal prolapse repair.  . TUBAL LIGATION Bilateral 2005    There were no vitals filed for this visit.      Subjective Assessment - 10/30/15 1107    Subjective "R knee and R hip are bothing me"   Currently in Pain? Yes   Pain Score 6    Pain Location Knee   Pain Orientation Right   Pain Descriptors / Indicators Aching   Pain Type Chronic pain   Pain Frequency Constant                         [facility] Adult PT Treatment/Exercise - 10/30/15 0001      Knee/Hip Exercises: Seated   Long Arc Quad AROM  2 x 10 with ball squeeze between knees  "pt fatigued quickly"      Manual Therapy   Manual Therapy Taping;Myofascial release   Manual therapy comments manual trigger point release along vastus lateralis x 3    Myofascial Release rolling over the vastus lateralis   McConnell Lateral > Medial trial taping                 PT Education - 10/30/15 1247    Education provided Yes    Education Details anatomy of muscle and causes of trigger points. Manual trigger point release techniques and benefits and what to expect to relieve tension. the soreness in the hip she currenlty has is more likely related to Delayed muscle soreness from exercises on Monday.    Person(s) Educated Patient;Other (comment)  pt Fiance   Methods Explanation;Verbal cues   Comprehension Verbalized understanding;Verbal cues required          PT Short Term Goals - 10/28/15 1530      PT SHORT TERM GOAL #1   Title pt will be I with inital HEP (10/31/2015)   Baseline cues needed   Time 3   Period Weeks   Status On-going     PT SHORT TERM GOAL #2   Title pt will be able to verbalize/ demo techniques to reduce bil pain and inflammation via RICE and HEP (10/31/2015)   Time 3   Period Weeks   Status On-going     PT SHORT TERM GOAL #3   Time 3   Period Weeks   Status Unable to assess           PT Long  Term Goals - 10/10/15 1334      PT LONG TERM GOAL #1   Title pt will be I with all advanced HEP given throughtout therapy (11/21/2015)   Time 6   Period Weeks   Status New     PT LONG TERM GOAL #2   Title pt will improve R knee arom to >/= 110 degrees of flexion and extension to 0 degrees with </= 3/10 painto assist with function and efficent gait pattern (11/21/2015)   Time 6   Period Weeks   Status New     PT LONG TERM GOAL #3   Title pt will improve bil LE strength to >/= 4/5 with </= 3/10 pain to assist with functional mobility and safety during walking/ standing (11/21/2015)   Time 6   Period Weeks   Status New     PT LONG TERM GOAL #4   Title pt will be able to sit/ stand/ walk >/= 20 min and navigate up/ down >/= 10 steps with 1 HHA using railing assist with </= 3/10 pain to promote funtional endurance required for school and ADLs (11/21/2015)   Time 6   Period Weeks   Status New     PT LONG TERM GOAL #5   Title Increase FOTO score to </= 46% limited to demonstrate  improvement in function (11/21/2015)   Time 6   Period Weeks   Status New               Plan - 10/30/15 1249    Clinical Impression Statement Mrs. Mcomber reported pain at a 6/10 today prior to treatement. following manual trigger point release and STM along the vastus lateralis and McConnel taping she reported pain relief in the knee. performed LAQ with ball squeeze but demonstrated significant fatigue. following todays session she rpeorted pain in the knee dropped to 2/10 with walking/ standing.    PT Next Visit Plan assess response to taping, continue manual on vastus lateralis if tolerated well, hip strengthening,    Consulted and Agree with Plan of Care Patient      Patient will benefit from skilled therapeutic intervention in order to improve the following deficits and impairments:  Abnormal gait, Decreased activity tolerance, Decreased endurance, Decreased balance, Pain, Improper body mechanics, Postural dysfunction, Increased fascial restricitons, Difficulty walking, Decreased range of motion, Hypomobility, Increased edema, Decreased strength  Visit Diagnosis: Pain in right knee  Stiffness of right knee, not elsewhere classified  Pain in left knee  Muscle weakness (generalized)  Other abnormalities of gait and mobility     Problem List Patient Active Problem List   Diagnosis Date Noted  . Intercostal pain 05/08/2015  . GERD (gastroesophageal reflux disease) 04/03/2015  . Internal hemorrhoid 09/03/2014  . Urge incontinence 12/13/2013  . Tension headache 09/25/2013  . Morbid obesity with BMI of 45.0-49.9, adult ([facility]) 05/15/2013  . Vitamin D deficiency 08/05/2012  . Asthma, chronic 07/28/2012  . Arthritis 07/28/2012  . Health maintenance examination 07/28/2012   Lulu RidingKristoffer Aydrien Froman PT, DPT, LAT, ATC  10/30/15  12:54 PM      [facility] [address] [place], [place], [address] Phone: [phone]   Fax:   [phone]  Name: Cassidy [name] MRN: [id number] Date of Birth: [birth year]

## 2015-11-04 ENCOUNTER — Ambulatory Visit: Payer: Medicare Other | Admitting: Physical Therapy

## 2015-11-04 DIAGNOSIS — M25661 Stiffness of right knee, not elsewhere classified: Secondary | ICD-10-CM

## 2015-11-04 DIAGNOSIS — M25562 Pain in left knee: Secondary | ICD-10-CM | POA: Diagnosis not present

## 2015-11-04 DIAGNOSIS — R2689 Other abnormalities of gait and mobility: Secondary | ICD-10-CM

## 2015-11-04 DIAGNOSIS — M25561 Pain in right knee: Secondary | ICD-10-CM

## 2015-11-04 DIAGNOSIS — M6281 Muscle weakness (generalized): Secondary | ICD-10-CM

## 2015-11-04 NOTE — Therapy (Signed)
[facility] [address] [place], [place], [address] Phone: [phone]   Fax:  [phone]  Physical Therapy Treatment  Patient Details  Name: Cassidy [name] MRN: [id number] Date of Birth: May 29, 1970 Referring Provider: Tyson Aliasuncan Thomas Vincent MD  Encounter Date: 11/04/2015      PT End of Session - 11/04/15 1238    Visit Number 4   Number of Visits 13   Date for PT Re-Evaluation 11/21/15   Authorization Type Medicare: kx mod by 15th visit, progress note by 10th visit   PT Start Time 1150   PT Stop Time 1230   PT Time Calculation (min) 40 min   Activity Tolerance Patient tolerated treatment well   Behavior During Therapy [facility] for tasks assessed/performed      Past Medical History:  Diagnosis Date  . Arthritis   . Asthma   . Morbid obesity with BMI of 45.0-49.9, adult ([facility]) 05/15/2013    Past Surgical History:  Procedure Laterality Date  . CHOLECYSTECTOMY  1998  . RECTAL PROLAPSE REPAIR, RECTOPEXY  2007   Uterine and rectal prolapse repair.  . TUBAL LIGATION Bilateral 2005    There were no vitals filed for this visit.      Subjective Assessment - 11/04/15 1157    Subjective "R knee giving me trouble, The tape really helped alot"    Currently in Pain? Yes   Pain Score 7    Pain Location Knee   Pain Orientation Right   Pain Descriptors / Indicators Aching   Pain Type Chronic pain   Pain Onset More than a month ago   Pain Frequency Constant   Aggravating Factors  bending, stairs, sittting   Pain Relieving Factors Medication, changing of position, heat, sit in hot water, heating pad                         [facility] Adult PT Treatment/Exercise - 11/04/15 0001      Knee/Hip Exercises: Aerobic   Nustep L2 x 5 min  UE/LE : reported decreased muscle soreness after     Knee/Hip Exercises: Seated   Long Arc Quad AROM;Strengthening;Right;2 sets;5 reps;Weights  with ball squeeze   Sit to Sand 2 sets;5  reps;with UE support  with ball squeeze, with hands on knees, table elevated     Manual Therapy   Manual therapy comments manual trigger point release along vastus lateralis x 3    McConnell Lateral > Medial trial taping   relief of soreness                  PT Short Term Goals - 10/28/15 1530      PT SHORT TERM GOAL #1   Title pt will be I with inital HEP (10/31/2015)   Baseline cues needed   Time 3   Period Weeks   Status On-going     PT SHORT TERM GOAL #2   Title pt will be able to verbalize/ demo techniques to reduce bil pain and inflammation via RICE and HEP (10/31/2015)   Time 3   Period Weeks   Status On-going     PT SHORT TERM GOAL #3   Time 3   Period Weeks   Status Unable to assess           PT Long Term Goals - 10/10/15 1334      PT LONG TERM GOAL #1   Title pt will be I with all advanced HEP given  throughtout therapy (11/21/2015)   Time 6   Period Weeks   Status New     PT LONG TERM GOAL #2   Title pt will improve R knee arom to >/= 110 degrees of flexion and extension to 0 degrees with </= 3/10 painto assist with function and efficent gait pattern (11/21/2015)   Time 6   Period Weeks   Status New     PT LONG TERM GOAL #3   Title pt will improve bil LE strength to >/= 4/5 with </= 3/10 pain to assist with functional mobility and safety during walking/ standing (11/21/2015)   Time 6   Period Weeks   Status New     PT LONG TERM GOAL #4   Title pt will be able to sit/ stand/ walk >/= 20 min and navigate up/ down >/= 10 steps with 1 HHA using railing assist with </= 3/10 pain to promote funtional endurance required for school and ADLs (11/21/2015)   Time 6   Period Weeks   Status New     PT LONG TERM GOAL #5   Title Increase FOTO score to </= 46% limited to demonstrate improvement in function (11/21/2015)   Time 6   Period Weeks   Status New               Plan - 11/04/15 1238    Clinical Impression Statement Mrs. Moch states the  McConnel taping relieved alot of pain, but continues to report sorenes in the muscles from doing her HEP. continued taping for patellar alignment and exercise to strengthening glutes and focus on medial quad activation but required multiple rest breaks due to fatigue and soreness. reported decreased pain with walking/ standing today. plan to attempt to send script to MD about getting patellar brace for knee.   PT Next Visit Plan continue taping,  hip strengthening, gait training, see if pt got script back from MD for knee brace for patella,    Consulted and Agree with Plan of Care Patient      Patient will benefit from skilled therapeutic intervention in order to improve the following deficits and impairments:  Abnormal gait, Decreased activity tolerance, Decreased endurance, Decreased balance, Pain, Improper body mechanics, Postural dysfunction, Increased fascial restricitons, Difficulty walking, Decreased range of motion, Hypomobility, Increased edema, Decreased strength  Visit Diagnosis: Pain in right knee  Stiffness of right knee, not elsewhere classified  Muscle weakness (generalized)  Pain in left knee  Other abnormalities of gait and mobility     Problem List Patient Active Problem List   Diagnosis Date Noted  . Intercostal pain 05/08/2015  . GERD (gastroesophageal reflux disease) 04/03/2015  . Internal hemorrhoid 09/03/2014  . Urge incontinence 12/13/2013  . Tension headache 09/25/2013  . Morbid obesity with BMI of 45.0-49.9, adult ([facility]) 05/15/2013  . Vitamin D deficiency 08/05/2012  . Asthma, chronic 07/28/2012  . Arthritis 07/28/2012  . Health maintenance examination 07/28/2012   Lulu RidingKristoffer Reighan Hipolito PT, DPT, LAT, ATC  11/04/15  12:43 PM      [facility] [address] [place], [place], [address] Phone: [phone]   Fax:  [phone]  Name: Cassidy [name] MRN: [id number] Date of Birth: 1970/07/31

## 2015-11-05 ENCOUNTER — Other Ambulatory Visit: Payer: Self-pay | Admitting: Internal Medicine

## 2015-11-06 ENCOUNTER — Ambulatory Visit: Payer: Medicare Other | Admitting: Physical Therapy

## 2015-11-06 DIAGNOSIS — R2689 Other abnormalities of gait and mobility: Secondary | ICD-10-CM | POA: Diagnosis not present

## 2015-11-06 DIAGNOSIS — M6281 Muscle weakness (generalized): Secondary | ICD-10-CM | POA: Diagnosis not present

## 2015-11-06 DIAGNOSIS — M25561 Pain in right knee: Secondary | ICD-10-CM

## 2015-11-06 DIAGNOSIS — M25562 Pain in left knee: Secondary | ICD-10-CM

## 2015-11-06 DIAGNOSIS — M25661 Stiffness of right knee, not elsewhere classified: Secondary | ICD-10-CM

## 2015-11-06 NOTE — Therapy (Signed)
[facility] [address] [place], [place], [address] Phone: [phone]   Fax:  [phone]  Physical Therapy Treatment  Patient Details  Name: Cassidy [name] MRN: [id number] Date of Birth: [birth year] Referring Provider: Tyson Aliasuncan Thomas Vincent MD  Encounter Date: 11/06/2015      PT End of Session - 11/06/15 1243    Visit Number 5   Number of Visits 13   Date for PT Re-Evaluation 11/21/15   Authorization Type Medicare: kx mod by 15th visit, progress note by 10th visit   PT Start Time 1200  pt arrived 15 minutes late today   PT Stop Time 1231   PT Time Calculation (min) 31 min   Activity Tolerance Patient tolerated treatment well   Behavior During Therapy [facility] for tasks assessed/performed      Past Medical History:  Diagnosis Date  . Arthritis   . Asthma   . Morbid obesity with BMI of 45.0-49.9, adult (HCC) 05/15/2013    Past Surgical History:  Procedure Laterality Date  . CHOLECYSTECTOMY  1998  . RECTAL PROLAPSE REPAIR, RECTOPEXY  2007   Uterine and rectal prolapse repair.  . TUBAL LIGATION Bilateral 2005    There were no vitals filed for this visit.      Subjective Assessment - 11/06/15 1200    Subjective "Doing a little better today, not as sore and the tape continues to help"    Currently in Pain? Yes   Pain Score 4    Pain Orientation Right   Pain Descriptors / Indicators Aching   Pain Type Chronic pain   Pain Onset More than a month ago   Pain Frequency Constant                         [facility] Adult PT Treatment/Exercise - 11/06/15 1208      Knee/Hip Exercises: Aerobic   Nustep L3 x 5 min     Knee/Hip Exercises: Standing   Gait Training Heel to toe walking using exaggerrated motions 4 x 20 ft     Knee/Hip Exercises: Seated   Long Arc Quad AROM;Strengthening;Right;Weights;10 reps;3 sets  squeezing ball between knees, 2 x 10 , 1 x 5     Knee/Hip Exercises: Sidelying   Hip ABduction  AROM;Strengthening;4 sets;Other (comment);5 reps;10 reps   Hip ABduction Limitations 2 sets bent knee clams x 10, 2 sets SLR x 5  pt fatigued quickly with SLR                 PT Education - 11/06/15 1242    Education provided Yes   Education Details gait efficiency and walking mechanics with heel to toe walking to conserve energy using heel to toe and how the motions effect the knee.    Person(s) Educated Patient   Methods Explanation;Demonstration;Verbal cues   Comprehension Verbalized understanding;Returned demonstration          PT Short Term Goals - 10/28/15 1530      PT SHORT TERM GOAL #1   Title pt will be I with inital HEP (10/31/2015)   Baseline cues needed   Time 3   Period Weeks   Status On-going     PT SHORT TERM GOAL #2   Title pt will be able to verbalize/ demo techniques to reduce bil pain and inflammation via RICE and HEP (10/31/2015)   Time 3   Period Weeks   Status On-going     PT SHORT TERM GOAL #3  Time 3   Period Weeks   Status Unable to assess           PT Long Term Goals - 10/10/15 1334      PT LONG TERM GOAL #1   Title pt will be I with all advanced HEP given throughtout therapy (11/21/2015)   Time 6   Period Weeks   Status New     PT LONG TERM GOAL #2   Title pt will improve R knee arom to >/= 110 degrees of flexion and extension to 0 degrees with </= 3/10 painto assist with function and efficent gait pattern (11/21/2015)   Time 6   Period Weeks   Status New     PT LONG TERM GOAL #3   Title pt will improve bil LE strength to >/= 4/5 with </= 3/10 pain to assist with functional mobility and safety during walking/ standing (11/21/2015)   Time 6   Period Weeks   Status New     PT LONG TERM GOAL #4   Title pt will be able to sit/ stand/ walk >/= 20 min and navigate up/ down >/= 10 steps with 1 HHA using railing assist with </= 3/10 pain to promote funtional endurance required for school and ADLs (11/21/2015)   Time 6   Period Weeks    Status New     PT LONG TERM GOAL #5   Title Increase FOTO score to </= 46% limited to demonstrate improvement in function (11/21/2015)   Time 6   Period Weeks   Status New               Plan - 11/06/15 1244    Clinical Impression Statement Mrs. Tiburcio PeaHarris reported she is doing better today. continued strengthening of the qauds and  R glute med. gait training to promote proper mechanics pt continues to fatigue quickly requiring multiple rest breaks.    PT Next Visit Plan continue taping,  hip strengthening, gait training, see if pt got script back from MD for knee brace for patella, ROM, goals   PT Home Exercise Plan gait training, added Sidelying SLR intermittenlty with sidelying clam    Consulted and Agree with Plan of Care Patient      Patient will benefit from skilled therapeutic intervention in order to improve the following deficits and impairments:  Abnormal gait, Decreased activity tolerance, Decreased endurance, Decreased balance, Pain, Improper body mechanics, Postural dysfunction, Increased fascial restricitons, Difficulty walking, Decreased range of motion, Hypomobility, Increased edema, Decreased strength  Visit Diagnosis: Pain in right knee  Stiffness of right knee, not elsewhere classified  Muscle weakness (generalized)  Pain in left knee  Other abnormalities of gait and mobility     Problem List Patient Active Problem List   Diagnosis Date Noted  . Intercostal pain 05/08/2015  . GERD (gastroesophageal reflux disease) 04/03/2015  . Internal hemorrhoid 09/03/2014  . Urge incontinence 12/13/2013  . Tension headache 09/25/2013  . Morbid obesity with BMI of 45.0-49.9, adult (HCC) 05/15/2013  . Vitamin D deficiency 08/05/2012  . Asthma, chronic 07/28/2012  . Arthritis 07/28/2012  . Health maintenance examination 07/28/2012   Lulu RidingKristoffer Leith Hedlund PT, DPT, LAT, ATC  11/06/15  12:48 PM      [facility] [address] [place], [place], [address] Phone: [phone]   Fax:  [phone]  Name: Cassidy [name] MRN: [id number] Date of Birth: Sep 09, 1970

## 2015-11-10 ENCOUNTER — Other Ambulatory Visit: Payer: Self-pay | Admitting: Internal Medicine

## 2015-11-11 ENCOUNTER — Ambulatory Visit: Payer: Medicare Other | Admitting: Physical Therapy

## 2015-11-13 ENCOUNTER — Ambulatory Visit: Payer: Medicare Other | Admitting: Physical Therapy

## 2015-11-13 DIAGNOSIS — M25661 Stiffness of right knee, not elsewhere classified: Secondary | ICD-10-CM

## 2015-11-13 DIAGNOSIS — M6281 Muscle weakness (generalized): Secondary | ICD-10-CM

## 2015-11-13 DIAGNOSIS — R2689 Other abnormalities of gait and mobility: Secondary | ICD-10-CM

## 2015-11-13 DIAGNOSIS — M25562 Pain in left knee: Secondary | ICD-10-CM | POA: Diagnosis not present

## 2015-11-13 DIAGNOSIS — M25561 Pain in right knee: Secondary | ICD-10-CM | POA: Diagnosis not present

## 2015-11-13 NOTE — Patient Instructions (Signed)

## 2015-11-13 NOTE — Therapy (Signed)
[facility] [address] [place], [place], [address] Phone: [phone]   Fax:  [phone]  Physical Therapy Treatment  Patient Details  Name: Cassidy [name] MRN: [id number] Date of Birth: [birth year] Referring Provider: Tyson Alias MD  Encounter Date: 11/13/2015      PT End of Session - 11/13/15 1242    Visit Number 6   Number of Visits 13   Date for PT Re-Evaluation 11/21/15   Authorization Type Medicare: kx mod by 15th visit, progress note by 10th visit   PT Start Time 1148   PT Stop Time 1241   PT Time Calculation (min) 53 min   Activity Tolerance Patient tolerated treatment well   Behavior During Therapy [facility] for tasks assessed/performed      Past Medical History:  Diagnosis Date  . Arthritis   . Asthma   . Morbid obesity with BMI of 45.0-49.9, adult (HCC) 05/15/2013    Past Surgical History:  Procedure Laterality Date  . CHOLECYSTECTOMY  1998  . RECTAL PROLAPSE REPAIR, RECTOPEXY  2007   Uterine and rectal prolapse repair.  . TUBAL LIGATION Bilateral 2005    There were no vitals filed for this visit.      Subjective Assessment - 11/13/15 1156    Subjective "Doing okay today, on Saturday I was some pain in the whole body"    Currently in Pain? Yes   Pain Score 4    Pain Location Knee   Pain Orientation Right   Pain Type Chronic pain   Pain Onset More than a month ago   Pain Frequency Constant   Aggravating Factors  bending the knees, stairs, sittings   Pain Relieving Factors medication, changing of position, heat            [facility] PT Assessment - 11/13/15 1202      AROM   Right Knee Extension 0   Right Knee Flexion 111                     [facility] Adult PT Treatment/Exercise - 11/13/15 0001      Knee/Hip Exercises: Aerobic   Nustep L5 x     Knee/Hip Exercises: Standing   Hip Abduction AROM;Stengthening;Both;2 sets;5 sets;10 reps  verbal cues for form   Hip  Extension AROM;Stengthening;Both;2 sets;5 reps  verbal cues for form     Knee/Hip Exercises: Seated   Long Arc Quad AROM;Strengthening;Right;Weights;10 reps;3 sets  ball between knees                PT Education - 11/13/15 1242    Education provided Yes   Education Details posture education regarding various positions and lifting and carrying mechanics.    Person(s) Educated Patient   Methods Explanation;Verbal cues;Handout   Comprehension Verbalized understanding;Verbal cues required          PT Short Term Goals - 11/13/15 1249      PT SHORT TERM GOAL #1   Title pt will be I with inital HEP (10/31/2015)   Time 3   Period Weeks   Status Achieved     PT SHORT TERM GOAL #2   Title pt will be able to verbalize/ demo techniques to reduce bil pain and inflammation via RICE and HEP (10/31/2015)   Time 3   Period Weeks   Status Achieved     PT SHORT TERM GOAL #3   Title pt will be able to sit / stand/ walk for >/= 10 min  with </= 5/10 pain to promote functional progression (10/31/2015)   Time 3   Period Weeks   Status Achieved           PT Long Term Goals - 10/10/15 1334      PT LONG TERM GOAL #1   Title pt will be I with all advanced HEP given throughtout therapy (11/21/2015)   Time 6   Period Weeks   Status New     PT LONG TERM GOAL #2   Title pt will improve R knee arom to >/= 110 degrees of flexion and extension to 0 degrees with </= 3/10 painto assist with function and efficent gait pattern (11/21/2015)   Time 6   Period Weeks   Status New     PT LONG TERM GOAL #3   Title pt will improve bil LE strength to >/= 4/5 with </= 3/10 pain to assist with functional mobility and safety during walking/ standing (11/21/2015)   Time 6   Period Weeks   Status New     PT LONG TERM GOAL #4   Title pt will be able to sit/ stand/ walk >/= 20 min and navigate up/ down >/= 10 steps with 1 HHA using railing assist with </= 3/10 pain to promote funtional endurance required  for school and ADLs (11/21/2015)   Time 6   Period Weeks   Status New     PT LONG TERM GOAL #5   Title Increase FOTO score to </= 46% limited to demonstrate improvement in function (11/21/2015)   Time 6   Period Weeks   Status New               Plan - 11/13/15 1243    Clinical Impression Statement Mrs. Tiburcio PeaHarris continues to report some pain but states it is improving. Continued hip strengthening which she wsa able to do but demonstrated fatigue requiring rest breaks. planned to continue taping the knee but pt was unable to hike pant leg up enough.    PT Next Visit Plan continue taping,  hip strengthening, gait training, see if pt got script back from MD for knee brace for patella   Consulted and Agree with Plan of Care Patient      Patient will benefit from skilled therapeutic intervention in order to improve the following deficits and impairments:  Abnormal gait, Decreased activity tolerance, Decreased endurance, Decreased balance, Pain, Improper body mechanics, Postural dysfunction, Increased fascial restricitons, Difficulty walking, Decreased range of motion, Hypomobility, Increased edema, Decreased strength  Visit Diagnosis: Pain in right knee  Stiffness of right knee, not elsewhere classified  Muscle weakness (generalized)  Pain in left knee  Other abnormalities of gait and mobility     Problem List Patient Active Problem List   Diagnosis Date Noted  . Intercostal pain 05/08/2015  . GERD (gastroesophageal reflux disease) 04/03/2015  . Internal hemorrhoid 09/03/2014  . Urge incontinence 12/13/2013  . Tension headache 09/25/2013  . Morbid obesity with BMI of 45.0-49.9, adult (HCC) 05/15/2013  . Vitamin D deficiency 08/05/2012  . Asthma, chronic 07/28/2012  . Arthritis 07/28/2012  . Health maintenance examination 07/28/2012   Lulu RidingKristoffer Emmitte Surgeon PT, DPT, LAT, ATC  11/13/15  12:51 PM      [facility] [address] [place], [place], [address] Phone: [phone]   Fax:  [phone]  Name: [name] Sessionisha Breithaupt MRN: [id number] Date of Birth: 1970-12-15

## 2015-11-15 ENCOUNTER — Encounter: Payer: Medicare Other | Admitting: Internal Medicine

## 2015-11-19 ENCOUNTER — Ambulatory Visit: Payer: Medicare Other | Attending: Internal Medicine | Admitting: Physical Therapy

## 2015-11-19 DIAGNOSIS — M25661 Stiffness of right knee, not elsewhere classified: Secondary | ICD-10-CM | POA: Diagnosis not present

## 2015-11-19 DIAGNOSIS — M25561 Pain in right knee: Secondary | ICD-10-CM | POA: Diagnosis not present

## 2015-11-19 DIAGNOSIS — M25562 Pain in left knee: Secondary | ICD-10-CM | POA: Diagnosis not present

## 2015-11-19 DIAGNOSIS — R2689 Other abnormalities of gait and mobility: Secondary | ICD-10-CM | POA: Diagnosis not present

## 2015-11-19 DIAGNOSIS — M6281 Muscle weakness (generalized): Secondary | ICD-10-CM | POA: Diagnosis not present

## 2015-11-19 NOTE — Therapy (Signed)
[facility] [address] [place], [place], [address] Phone: [phone]   Fax:  [phone]  Physical Therapy Treatment  Patient Details  Name: Cassidy [name] MRN: [id number] Date of Birth: [birth year] Referring Provider: Tyson Alias MD  Encounter Date: 11/19/2015      PT End of Session - 11/19/15 1732    Visit Number 7   Number of Visits 13   Date for PT Re-Evaluation 11/21/15   PT Start Time 1150   PT Stop Time 1240   PT Time Calculation (min) 50 min   Activity Tolerance Patient tolerated treatment well   Behavior During Therapy [facility] for tasks assessed/performed      Past Medical History:  Diagnosis Date  . Arthritis   . Asthma   . Morbid obesity with BMI of 45.0-49.9, adult (HCC) 05/15/2013    Past Surgical History:  Procedure Laterality Date  . CHOLECYSTECTOMY  1998  . RECTAL PROLAPSE REPAIR, RECTOPEXY  2007   Uterine and rectal prolapse repair.  . TUBAL LIGATION Bilateral 2005    There were no vitals filed for this visit.      Subjective Assessment - 11/19/15 1157    Subjective Knees hurt when I am up on my legs period,  Stairs always hurt.    Patient is accompained by: Family member   Currently in Pain? Yes   Pain Score 5    Pain Location Knee   Pain Orientation Right   Pain Descriptors / Indicators Aching   Pain Type Chronic pain   Pain Frequency Constant   Aggravating Factors  standing walking,  rolling over in bed   Pain Relieving Factors medication,  sometimes nothing helps    Effect of Pain on Daily Activities limits walking   Multiple Pain Sites --  When back spasms it is between 8-15/10, wakes her up.  worse with ADL's,  wrist painful rolling over in bed,                          [facility] Adult PT Treatment/Exercise - 11/19/15 0001      Knee/Hip Exercises: Stretches   Other Knee/Hip Stretches lower trunk rotation legs on red ball 5= X  CGA     Knee/Hip Exercises:  Aerobic   Nustep 6 minutes     Knee/Hip Exercises: Standing   Hip Flexion 1 set;10 reps  noted legs shakey,  No knee pain with this   Functional Squat Limitations mini squat (1 inch) 2 steps side to side X 1  each, stopped due to pain   Gait Training Attempted YOGA walking,  it did not help form or pain     Knee/Hip Exercises: Seated   Sit to Sand 5 reps  increased knee pain right so stopped     Knee/Hip Exercises: Supine   Bridges Limitations 10 X legs on ball   Straight Leg Raises --  2 x painful knee abs back   Other Supine Knee/Hip Exercises ball squeeze 5 X stopped due to pain back, hip,  unable to control neutral spine more than 1 X   Other Supine Knee/Hip Exercises Hip Isometric 5 X with black gait belt,  noted Lt lateral hip pain   Noted left hip pain with these 10 X     Knee/Hip Exercises: Sidelying   Clams 10 X 2 sets Rtght     Modalities   Modalities --  declined     Manual Therapy  Manual Therapy Taping  patella and patellar tendon   McConnell lateral to medial                  PT Short Term Goals - 11/19/15 1734      PT SHORT TERM GOAL #1   Title pt will be I with inital HEP (10/31/2015)   Time 3   Period Weeks   Status Achieved     PT SHORT TERM GOAL #2   Title pt will be able to verbalize/ demo techniques to reduce bil pain and inflammation via RICE and HEP (10/31/2015)   Time 3   Period Weeks   Status Achieved     PT SHORT TERM GOAL #3   Title pt will be able to sit / stand/ walk for >/= 10 min with </= 5/10 pain to promote functional progression (10/31/2015)   Time 3   Period Weeks   Status Achieved           PT Long Term Goals - 11/19/15 1735      PT LONG TERM GOAL #1   Title pt will be I with all advanced HEP given throughtout therapy (11/21/2015)   Time 6   Period Weeks   Status On-going     PT LONG TERM GOAL #2   Title pt will improve R knee arom to >/= 110 degrees of flexion and extension to 0 degrees with </= 3/10  painto assist with function and efficent gait pattern (11/21/2015)   Time 6   Period Weeks   Status Unable to assess     PT LONG TERM GOAL #3   Title pt will improve bil LE strength to >/= 4/5 with </= 3/10 pain to assist with functional mobility and safety during walking/ standing (11/21/2015)   Baseline unable to do independent SLR    Time 6   Period Weeks   Status On-going     PT LONG TERM GOAL #4   Title pt will be able to sit/ stand/ walk >/= 20 min and navigate up/ down >/= 10 steps with 1 HHA using railing assist with </= 3/10 pain to promote funtional endurance required for school and ADLs (11/21/2015)   Baseline Pain continues today (7/10)   Time 6   Period Weeks   Status On-going     PT LONG TERM GOAL #5   Title Increase FOTO score to </= 46% limited to demonstrate improvement in function (11/21/2015)   Time 6   Period Weeks   Status Unable to assess               Plan - 11/19/15 1732    Clinical Impression Statement Most all exercises increased her pain today.  Pain 7/10 pain noted at end of session.  Patient declined the need of modalities.    PT Next Visit Plan continue taping,  hip strengthening, gait training, see if pt got script back from MD for knee brace for patella  ERO POC 11/21/2015   PT Home Exercise Plan continue      Patient will benefit from skilled therapeutic intervention in order to improve the following deficits and impairments:  Abnormal gait, Decreased activity tolerance, Decreased endurance, Decreased balance, Pain, Improper body mechanics, Postural dysfunction, Increased fascial restricitons, Difficulty walking, Decreased range of motion, Hypomobility, Increased edema, Decreased strength  Visit Diagnosis: Pain in right knee  Stiffness of right knee, not elsewhere classified  Muscle weakness (generalized)  Pain in left knee  Other abnormalities of gait  and mobility     Problem List Patient Active Problem List   Diagnosis Date Noted   . Intercostal pain 05/08/2015  . GERD (gastroesophageal reflux disease) 04/03/2015  . Internal hemorrhoid 09/03/2014  . Urge incontinence 12/13/2013  . Tension headache 09/25/2013  . Morbid obesity with BMI of 45.0-49.9, adult (HCC) 05/15/2013  . Vitamin D deficiency 08/05/2012  . Asthma, chronic 07/28/2012  . Arthritis 07/28/2012  . Health maintenance examination 07/28/2012    [facility] 11/19/2015, 5:38 PM  [facility] [address] [place], [place], [address] Phone: [phone]   Fax:  [phone]  Name: Kiamesha Samet MRN: [id number] Date of Birth: 06-03-1970   Lanisha Stepanian, PTA 11/19/15 5:38 PM Phone: [phone] Fax: [phone]

## 2015-11-21 ENCOUNTER — Ambulatory Visit: Payer: Medicare Other | Admitting: Physical Therapy

## 2015-11-21 DIAGNOSIS — M25562 Pain in left knee: Secondary | ICD-10-CM

## 2015-11-21 DIAGNOSIS — R2689 Other abnormalities of gait and mobility: Secondary | ICD-10-CM | POA: Diagnosis not present

## 2015-11-21 DIAGNOSIS — M25661 Stiffness of right knee, not elsewhere classified: Secondary | ICD-10-CM | POA: Diagnosis not present

## 2015-11-21 DIAGNOSIS — M6281 Muscle weakness (generalized): Secondary | ICD-10-CM

## 2015-11-21 DIAGNOSIS — M25561 Pain in right knee: Secondary | ICD-10-CM

## 2015-11-21 NOTE — Therapy (Signed)
[facility] [address] [place], [place], [address] Phone: [phone]   Fax:  [phone]  Physical Therapy Treatment  Patient Details  Name: Cassidy [name] MRN: [id number] Date of Birth: 18-Jul-1970 Referring Provider: Tyson Alias MD  Encounter Date: 11/21/2015      PT End of Session - 11/21/15 1318    Visit Number 8   Number of Visits 13   Date for PT Re-Evaluation 11/21/15   PT Start Time 1152   PT Stop Time 1249   PT Time Calculation (min) 57 min   Activity Tolerance Patient limited by pain   Behavior During Therapy [facility] for tasks assessed/performed      Past Medical History:  Diagnosis Date  . Arthritis   . Asthma   . Morbid obesity with BMI of 45.0-49.9, adult (HCC) 05/15/2013    Past Surgical History:  Procedure Laterality Date  . CHOLECYSTECTOMY  1998  . RECTAL PROLAPSE REPAIR, RECTOPEXY  2007   Uterine and rectal prolapse repair.  . TUBAL LIGATION Bilateral 2005    There were no vitals filed for this visit.      Subjective Assessment - 11/21/15 1158    Subjective knee 3/10 right.  No pain left knee.  I was not going up/down stairs as much I did not irritate as much. The tape helped.     Currently in Pain? Yes   Pain Score 3    Pain Location Knee   Pain Orientation Right            [facility] PT Assessment - 11/21/15 0001      AROM   Right Knee Extension 0   Right Knee Flexion 126   Left Knee Extension 0   Left Knee Flexion 126                     [facility] Adult PT Treatment/Exercise - 11/21/15 0001      Knee/Hip Exercises: Aerobic   Nustep 6 minutes  L6 , small motions.      Knee/Hip Exercises: Standing   Hip Flexion 1 set;10 reps;Both   Hip Abduction Both;1 set;10 reps     Knee/Hip Exercises: Seated   Sit to Sand 5 reps  tearful: 8.5 to 9/10 pain so stopped  Mat: inches 20.5  high     Knee/Hip Exercises: Supine   Quad Sets 10 reps   Short Arc Quad Sets 2 sets;10  reps   Short Arc Quad Sets Limitations 3 pounds 2nd set   Other Supine Knee/Hip Exercises wedge squeeze 10 X 5 seconds.       Moist Heat Therapy   Number Minutes Moist Heat 3 Minutes  stopped due to being too hot,  she declined extra layers.   Moist Heat Location --  skin non red     Cryotherapy   Type of Cryotherapy --  attempted, did not use     Electrical Stimulation   Electrical Stimulation Location knee   Electrical Stimulation Action IFC   Electrical Stimulation Parameters 6   Electrical Stimulation Goals Pain     Manual Therapy   Manual Therapy --  intact from last visit                  PT Short Term Goals - 11/19/15 1734      PT SHORT TERM GOAL #1   Title pt will be I with inital HEP (10/31/2015)   Time 3   Period Weeks  Status Achieved     PT SHORT TERM GOAL #2   Title pt will be able to verbalize/ demo techniques to reduce bil pain and inflammation via RICE and HEP (10/31/2015)   Time 3   Period Weeks   Status Achieved     PT SHORT TERM GOAL #3   Title pt will be able to sit / stand/ walk for >/= 10 min with </= 5/10 pain to promote functional progression (10/31/2015)   Time 3   Period Weeks   Status Achieved           PT Long Term Goals - 11/19/15 1735      PT LONG TERM GOAL #1   Title pt will be I with all advanced HEP given throughtout therapy (11/21/2015)   Time 6   Period Weeks   Status On-going     PT LONG TERM GOAL #2   Title pt will improve R knee arom to >/= 110 degrees of flexion and extension to 0 degrees with </= 3/10 painto assist with function and efficent gait pattern (11/21/2015)   Time 6   Period Weeks   Status Unable to assess     PT LONG TERM GOAL #3   Title pt will improve bil LE strength to >/= 4/5 with </= 3/10 pain to assist with functional mobility and safety during walking/ standing (11/21/2015)   Baseline unable to do independent SLR    Time 6   Period Weeks   Status On-going     PT LONG TERM GOAL #4    Title pt will be able to sit/ stand/ walk >/= 20 min and navigate up/ down >/= 10 steps with 1 HHA using railing assist with </= 3/10 pain to promote funtional endurance required for school and ADLs (11/21/2015)   Baseline Pain continues today (7/10)   Time 6   Period Weeks   Status On-going     PT LONG TERM GOAL #5   Title Increase FOTO score to </= 46% limited to demonstrate improvement in function (11/21/2015)   Time 6   Period Weeks   Status Unable to assess               Plan - 11/21/15 1319    Clinical Impression Statement pain OK with session until sit to stand 9/10 pain with tears.  She did not say a word,  just was crying.   Trial of IFC to see if helpful for pain.  she did not tolerate heat or ice.     PT Next Visit Plan ERO POC ends today.  NO order yet for knee brace.    PT Home Exercise Plan pain free   Consulted and Agree with Plan of Care Patient      Patient will benefit from skilled therapeutic intervention in order to improve the following deficits and impairments:  Abnormal gait, Decreased activity tolerance, Decreased endurance, Decreased balance, Pain, Improper body mechanics, Postural dysfunction, Increased fascial restricitons, Difficulty walking, Decreased range of motion, Hypomobility, Increased edema, Decreased strength  Visit Diagnosis: Pain in right knee  Stiffness of right knee, not elsewhere classified  Muscle weakness (generalized)  Pain in left knee  Other abnormalities of gait and mobility     Problem List Patient Active Problem List   Diagnosis Date Noted  . Intercostal pain 05/08/2015  . GERD (gastroesophageal reflux disease) 04/03/2015  . Internal hemorrhoid 09/03/2014  . Urge incontinence 12/13/2013  . Tension headache 09/25/2013  . Morbid obesity with BMI  of 45.0-49.9, adult (HCC) 05/15/2013  . Vitamin D deficiency 08/05/2012  . Asthma, chronic 07/28/2012  . Arthritis 07/28/2012  . Health maintenance examination 07/28/2012     [facility] 11/21/2015, 1:23 PM  [facility] [address] [place], [place], [address] Phone: [phone]   Fax:  [phone]  Name: Cassidy [name] MRN: [id number] Date of Birth: 13-May-1970   Mairen Wallenstein, PTA 11/21/15 1:23 PM Phone: [phone] Fax: [phone]

## 2015-11-25 ENCOUNTER — Ambulatory Visit: Payer: Medicare Other | Admitting: Physical Therapy

## 2015-11-25 DIAGNOSIS — M25562 Pain in left knee: Secondary | ICD-10-CM | POA: Diagnosis not present

## 2015-11-25 DIAGNOSIS — M6281 Muscle weakness (generalized): Secondary | ICD-10-CM | POA: Diagnosis not present

## 2015-11-25 DIAGNOSIS — M25561 Pain in right knee: Secondary | ICD-10-CM | POA: Diagnosis not present

## 2015-11-25 DIAGNOSIS — R2689 Other abnormalities of gait and mobility: Secondary | ICD-10-CM

## 2015-11-25 DIAGNOSIS — M25661 Stiffness of right knee, not elsewhere classified: Secondary | ICD-10-CM

## 2015-11-25 NOTE — Therapy (Signed)
[facility] [address] [place], [place], [address] Phone: [phone]   Fax:  [phone]  Physical Therapy Treatment  Patient Details  Name: Cassidy [name] MRN: [id number] Date of Birth: December 29, 1970 Referring Provider: Tyson Alias MD  Encounter Date: 11/25/2015      PT End of Session - 11/25/15 1318    Visit Number 9   Number of Visits 13   Date for PT Re-Evaluation 11/21/15   PT Start Time 1200   PT Stop Time 1235   PT Time Calculation (min) 35 min   Activity Tolerance Patient tolerated treatment well   Behavior During Therapy [facility] for tasks assessed/performed      Past Medical History:  Diagnosis Date  . Arthritis   . Asthma   . Morbid obesity with BMI of 45.0-49.9, adult (HCC) 05/15/2013    Past Surgical History:  Procedure Laterality Date  . CHOLECYSTECTOMY  1998  . RECTAL PROLAPSE REPAIR, RECTOPEXY  2007   Uterine and rectal prolapse repair.  . TUBAL LIGATION Bilateral 2005    There were no vitals filed for this visit.      Subjective Assessment - 11/25/15 1216    Subjective 4/10 knee pain.  Pain in wrist is worse.  I do not do exercises every day.  I get busy,  sometimes they hurt,  sometimes I cheat and don't do them because I have been more active that day.    Patient is accompained by: Family member   Currently in Pain? Yes   Pain Score 5    Pain Orientation Right   Pain Descriptors / Indicators Aching;Sharp   Pain Frequency Constant   Aggravating Factors  stairs, standing, walking   Pain Relieving Factors medication,  pain usually eases by the next morning.   Effect of Pain on Daily Activities limits walking   Multiple Pain Sites --  Wrist pain,  shoots to thumb abd uo arm to shoulder            [facility] PT Assessment - 11/25/15 0001      Observation/Other Assessments   Focus on Therapeutic Outcomes (FOTO)  65% limitation,  was 55% limitation on intake     Strength   Right Hip  Flexion 4+/5   Left Hip Flexion 4+/5   Right Knee Flexion 4+/5   Right Knee Extension 4+/5   Left Knee Flexion 4+/5   Left Knee Extension 4+/5                     [facility] Adult PT Treatment/Exercise - 11/25/15 0001      Knee/Hip Exercises: Stretches   Gastroc Stretch Limitations tried on step too painful,  tried traditional runner's stretch , painful,  incline board worked 10 X 3      Knee/Hip Exercises: Aerobic   Nustep 7 minutes legs only,  level 4 small motions initially     Knee/Hip Exercises: Standing   Heel Raises 10 reps  toe lifts.  ,  cues.   Heel Raises Limitations Some medial pain notede   Forward Step Up Both;1 set;10 reps;Hand Hold: 2;Step Height: 2"     Knee/Hip Exercises: Seated   Heel Slides 1 set;10 reps  foot on pillowcase   Stool Scoot - Round Trips 16feet forward, both increased pain to 6/10,  then reverse 8 feet with AA   Marching Limitations 10 x alternating from higher mat  PT Short Term Goals - 11/25/15 1409      PT SHORT TERM GOAL #1   Title pt will be I with inital HEP (10/31/2015)   Time 3   Period Weeks   Status Achieved     PT SHORT TERM GOAL #2   Title pt will be able to verbalize/ demo techniques to reduce bil pain and inflammation via RICE and HEP (10/31/2015)   Period Weeks   Status Achieved     PT SHORT TERM GOAL #3   Title pt will be able to sit / stand/ walk for >/= 10 min with </= 5/10 pain to promote functional progression (10/31/2015)   Time 3   Period Weeks   Status Achieved           PT Long Term Goals - 11/19/15 1735      PT LONG TERM GOAL #1   Title pt will be I with all advanced HEP given throughtout therapy (11/21/2015)   Time 6   Period Weeks   Status On-going     PT LONG TERM GOAL #2   Title pt will improve R knee arom to >/= 110 degrees of flexion and extension to 0 degrees with </= 3/10 painto assist with function and efficent gait pattern (11/21/2015)   Time 6   Period Weeks    Status Unable to assess     PT LONG TERM GOAL #3   Title pt will improve bil LE strength to >/= 4/5 with </= 3/10 pain to assist with functional mobility and safety during walking/ standing (11/21/2015)   Baseline unable to do independent SLR    Time 6   Period Weeks   Status On-going     PT LONG TERM GOAL #4   Title pt will be able to sit/ stand/ walk >/= 20 min and navigate up/ down >/= 10 steps with 1 HHA using railing assist with </= 3/10 pain to promote funtional endurance required for school and ADLs (11/21/2015)   Baseline Pain continues today (7/10)   Time 6   Period Weeks   Status On-going     PT LONG TERM GOAL #5   Title Increase FOTO score to </= 46% limited to demonstrate improvement in function (11/21/2015)   Time 6   Period Weeks   Status Unable to assess               Plan - 11/25/15 1319    Clinical Impression Statement Focus today on low impact closed chain exercises. Pain at end of session 6/10.  She declined the need for modalities. Hip flexion and knee flexion , extension 4+/5, bothShe was unclear with her answers questions about her goals.  FOTO score65% limitation,  at intake she was 55% limitation.  She also has wrist pain that we are not treating.    PT Next Visit Plan ERO.  Must see a PT.   NO order yet for knee brace.    PT Home Exercise Plan pain free  try to get consistant with exercises.   Consulted and Agree with Plan of Care Patient;Family member/caregiver      Patient will benefit from skilled therapeutic intervention in order to improve the following deficits and impairments:  Abnormal gait, Decreased activity tolerance, Decreased endurance, Decreased balance, Pain, Improper body mechanics, Postural dysfunction, Increased fascial restricitons, Difficulty walking, Decreased range of motion, Hypomobility, Increased edema, Decreased strength  Visit Diagnosis: Pain in right knee  Stiffness of right knee, not elsewhere classified  Muscle weakness  (generalized)  Pain in left knee  Other abnormalities of gait and mobility     Problem List Patient Active Problem List   Diagnosis Date Noted  . Intercostal pain 05/08/2015  . GERD (gastroesophageal reflux disease) 04/03/2015  . Internal hemorrhoid 09/03/2014  . Urge incontinence 12/13/2013  . Tension headache 09/25/2013  . Morbid obesity with BMI of 45.0-49.9, adult (HCC) 05/15/2013  . Vitamin D deficiency 08/05/2012  . Asthma, chronic 07/28/2012  . Arthritis 07/28/2012  . Health maintenance examination 07/28/2012    [facility] 11/25/2015, 6:27 PM  [facility] [address] [place], [place], [address] Phone: [phone]   Fax:  [phone]  Name: Harleen Fineberg MRN: [id number] Date of Birth: 05/24/1970   Starlet Gallentine, PTA 11/25/15 6:27 PM Phone: [phone] Fax: [phone]

## 2015-11-27 ENCOUNTER — Ambulatory Visit: Payer: Medicare Other | Admitting: Physical Therapy

## 2015-11-29 ENCOUNTER — Ambulatory Visit: Payer: Medicare Other | Admitting: Rehabilitative and Restorative Service Providers"

## 2015-11-29 DIAGNOSIS — M25561 Pain in right knee: Secondary | ICD-10-CM

## 2015-11-29 DIAGNOSIS — R2689 Other abnormalities of gait and mobility: Secondary | ICD-10-CM | POA: Diagnosis not present

## 2015-11-29 DIAGNOSIS — M6281 Muscle weakness (generalized): Secondary | ICD-10-CM | POA: Diagnosis not present

## 2015-11-29 DIAGNOSIS — M25661 Stiffness of right knee, not elsewhere classified: Secondary | ICD-10-CM | POA: Diagnosis not present

## 2015-11-29 DIAGNOSIS — M25562 Pain in left knee: Secondary | ICD-10-CM | POA: Diagnosis not present

## 2015-11-29 NOTE — Patient Instructions (Addendum)
Discussed being even  More compliant with HEP for pain management. No pain at tx end, only soreness. Advised pt she may be sore in inguinal crease next day and to amb more today to decrease risk of soreness. She agreed

## 2015-11-29 NOTE — Therapy (Signed)
[facility] [address] [place], [place], [address] Phone: [phone]   Fax:  [phone]  Physical Therapy Treatment/Recert  Patient Details  Name: Cassidy [name] Sessionisha Reade MRN: [id number] Date of Birth: September 06, 1970 Referring Provider: Tyson Aliasuncan Thomas Vincent MD  Encounter Date: 11/29/2015      PT End of Session - 11/29/15 0928    Visit Number 10   Number of Visits 14   Date for PT Re-Evaluation 12/27/15   Authorization Type medicare   PT Start Time 0849   PT Stop Time 0934   PT Time Calculation (min) 45 min   Activity Tolerance Patient tolerated treatment well   [facility] tasks assessed/performed      Past Medical History:  Diagnosis Date  . Arthritis   . Asthma   . Morbid obesity with BMI of 45.0-49.9, adult (HCC) 05/15/2013    Past Surgical History:  Procedure Laterality Date  . CHOLECYSTECTOMY  1998  . RECTAL PROLAPSE REPAIR, RECTOPEXY  2007   Uterine and rectal prolapse repair.  . TUBAL LIGATION Bilateral 2005    There were no vitals filed for this visit.      Subjective Assessment - 11/29/15 0856    Subjective 4/10 knee pain.  Pain in wrist is worse; go to MD in 2 weeks. I am doing a little better with my exercises   Limitations Sitting;Lifting;Standing;Walking;House hold activities   How long can you sit comfortably? 10 min   How long can you stand comfortably? everything has improved; not sure how long can stand though   How long can you walk comfortably? much better   Diagnostic tests No recent imaging   Patient Stated Goals to be even more comfortable    Currently in Pain? Yes   Pain Score 4    Pain Location Knee   Pain Orientation Right   Pain Descriptors / Indicators Aching   Pain Type Chronic pain   Pain Radiating Towards no radiation   Pain Onset More than a month ago   Pain Frequency Constant   Aggravating Factors  stairs, WB activities   Pain Relieving Factors medication, HEP    Effect of Pain on Daily Activities limits walking   Multiple Pain Sites No                         [facility] Adult PT Treatment/Exercise - 11/29/15 0001      Knee/Hip Exercises: Seated   Other Seated Knee/Hip Exercises NuStep level 3 x 5 min with PT verbal cues for quad contraction     Knee/Hip Exercises: Supine   Heel Slides --  x 10 bil   Straight Leg Raises --  x 10 with difficulty with eccentric control bil   Other Supine Knee/Hip Exercises SLR with hip abdct combo x 10 bil with improved eccentric control                PT Education - 11/29/15 0902    Education provided Yes   Education Details see pt instructions   Person(s) Educated Patient   Methods Explanation   Comprehension Verbalized understanding          PT Short Term Goals - 11/29/15 0902      PT SHORT TERM GOAL #1   Title I with HEP   Baseline no cues needed   Status Achieved     PT SHORT TERM GOAL #2   Title Pt is I with usage of ICE  and HEP but chooses to not ice   Status Achieved     PT SHORT TERM GOAL #3   Title pt will be able to sit / stand/ walk for >/= 10 min with </= 5/10 pain to promote functional progression (10/31/2015)   Status Achieved           PT Long Term Goals - 11/29/15 0905      PT LONG TERM GOAL #1   Title pt will be I with all advanced HEP given throughtout therapy (11/21/2015)   Time 6   Period Weeks   Status On-going     PT LONG TERM GOAL #2   Title pt will improve R knee arom to >/= 110 degrees of flexion and extension to 0 degrees with </= 3/10 painto assist with function and efficent gait pattern (11/21/2015); AROM 126 degrees with pain  at 3/10   Status Achieved     PT LONG TERM GOAL #3   Title pt will improve bil LE strength to >/= 4/5 with </= 3/10 pain to assist with functional mobility and safety during walking/ standing (11/21/2015)   Baseline able to do    Time 6   Period Weeks   Status On-going     PT LONG TERM GOAL #4   Title pt  will be able to sit/ stand/ walk >/= 20 min and navigate up/ down >/= 10 steps with 1 HHA using railing assist with </= 3/10 pain to promote funtional endurance required for school and ADLs (11/21/2015)   Baseline pain with 5/10-7/10   Time 6   Period Weeks   Status On-going     PT LONG TERM GOAL #5   Title Increase FOTO score to </= 46% limited to demonstrate improvement in function (11/21/2015)   Time 6   Period Weeks   Status On-going     Additional Long Term Goals   Additional Long Term Goals --     PT LONG TERM GOAL #6   Title Pt will be able to transfer in/out of tub with 75% less difficulty   Baseline 100% difficult; has hurt wrist transferring in/out of tub   Time 6   Period Weeks   Status New     PT LONG TERM GOAL #7   Title Pt will be able to perform 20 SLR without compensation and improved eccentric descent 100% of the time   Baseline 30% of the time x 10 reps bil   Time 6   Period Weeks   Status New               Plan - 11/29/15 0930    Clinical Impression Statement Pt demonstrates improvement in knee ROM and functional strengthening which increases falls risk. Pain did not increase with mobility. all strengthening has improved except eccentric control   Rehab Potential Good   PT Frequency 2x / week   PT Duration 4 weeks   PT Treatment/Interventions ADLs/Self Care Home Management;Cryotherapy;Electrical Stimulation;Iontophoresis 4mg /ml Dexamethasone;Passive range of motion;Vasopneumatic Device;Taping;Dry needling;Therapeutic activities;Therapeutic exercise;Moist Heat;Manual techniques   PT Next Visit Plan continue SLR exercises and stepups   PT Home Exercise Plan pain free  try to get consistant with exercises.   Consulted and Agree with Plan of Care Patient      Patient will benefit from skilled therapeutic intervention in order to improve the following deficits and impairments:  Abnormal gait, Decreased activity tolerance, Decreased endurance, Decreased  balance, Pain, Improper body mechanics, Postural dysfunction, Increased fascial restricitons, Difficulty  walking, Decreased range of motion, Hypomobility, Increased edema, Decreased strength  Visit Diagnosis: Pain in right knee - Plan: PT plan of care cert/re-cert  Stiffness of right knee, not elsewhere classified - Plan: PT plan of care cert/re-cert  Muscle weakness (generalized) - Plan: PT plan of care cert/re-cert       G-Codes - 11/30/15 0911    Functional Limitation Mobility: Walking and moving around   Mobility: Walking and Moving Around Current Status [id number]) At least 40 percent but less than 60 percent impaired, limited or restricted   Mobility: Walking and Moving Around Goal Status [phone]) At least 20 percent but less than 40 percent impaired, limited or restricted      Problem List Patient Active Problem List   Diagnosis Date Noted  . Intercostal pain 05/08/2015  . GERD (gastroesophageal reflux disease) 04/03/2015  . Internal hemorrhoid 09/03/2014  . Urge incontinence 12/13/2013  . Tension headache 09/25/2013  . Morbid obesity with BMI of 45.0-49.9, adult (HCC) 05/15/2013  . Vitamin D deficiency 08/05/2012  . Asthma, chronic 07/28/2012  . Arthritis 07/28/2012  . Health maintenance examination 07/28/2012    Thornell Sartorius, PT 11/30/15, 11:25 AM  [facility] [address] [place], [place], [address] Phone: [phone]   Fax:  [phone]  Name: Malerie Eakins MRN: [id number] Date of Birth: 16-Apr-1970

## 2015-12-02 ENCOUNTER — Ambulatory Visit: Payer: Medicare Other | Admitting: Physical Therapy

## 2015-12-02 DIAGNOSIS — M25561 Pain in right knee: Secondary | ICD-10-CM

## 2015-12-02 DIAGNOSIS — M25661 Stiffness of right knee, not elsewhere classified: Secondary | ICD-10-CM | POA: Diagnosis not present

## 2015-12-02 DIAGNOSIS — M6281 Muscle weakness (generalized): Secondary | ICD-10-CM | POA: Diagnosis not present

## 2015-12-02 DIAGNOSIS — R2689 Other abnormalities of gait and mobility: Secondary | ICD-10-CM

## 2015-12-02 DIAGNOSIS — M25562 Pain in left knee: Secondary | ICD-10-CM

## 2015-12-02 NOTE — Patient Instructions (Addendum)
Straight Leg Raise    Tighten stomach.  Rest legs over "Husband pillow" and begin lift from there.  5 X each progressing reps to 10 -20-30.    Mountain climber at the sink. 15 seconds  Then progress to longer  Alternate with march.  Then try march and reach.  (may do sitting or standing).  Land on your feet softly.   DEAD BUG:   On back  Knees bent.  Lift arms overhead.  Riais one arm and opposite leg.  Lower slowly keeping back press to bed.  Repeat with other side.  Count reps, or time.   Work up to 3 minutes.  minutes   http://orth.exer.us/1103   Copyright  VHI. All rights reserved.

## 2015-12-02 NOTE — Therapy (Signed)
[facility] [address] [place], [place], [address] Phone: [phone]   Fax:  [phone]  Physical Therapy Treatment  Patient Details  Name: Cassidy [name] MRN: [id number] Date of Birth: 01-28-1971 Referring Provider: Tyson Alias MD  Encounter Date: 12/02/2015      PT End of Session - 12/02/15 1313    Visit Number 11   Number of Visits 14   Date for PT Re-Evaluation 12/27/15   PT Start Time 1153   PT Stop Time 1235   PT Time Calculation (min) 42 min   Activity Tolerance Patient tolerated treatment well   Behavior During Therapy [facility] for tasks assessed/performed      Past Medical History:  Diagnosis Date  . Arthritis   . Asthma   . Morbid obesity with BMI of 45.0-49.9, adult (HCC) 05/15/2013    Past Surgical History:  Procedure Laterality Date  . CHOLECYSTECTOMY  1998  . RECTAL PROLAPSE REPAIR, RECTOPEXY  2007   Uterine and rectal prolapse repair.  . TUBAL LIGATION Bilateral 2005    There were no vitals filed for this visit.      Subjective Assessment - 12/02/15 1159    Subjective Right 3/10,  left 5/10   Currently in Pain? Yes   Pain Score 5    Pain Location Knee   Pain Orientation Right;Left   Pain Descriptors / Indicators Aching   Pain Type Chronic pain   Aggravating Factors  stairs,  sit to stand.  weightbearing   Pain Relieving Factors medication, rest,  exercises   Effect of Pain on Daily Activities limits walking   Multiple Pain Sites No                         [facility] Adult PT Treatment/Exercise - 12/02/15 0001      Knee/Hip Exercises: Aerobic   Nustep 7 minutes, level 4, legs only.     Knee/Hip Exercises: Standing   Gait Training [place] fore home exercise.  starting 3 minutes at a time.    Other Standing Knee Exercises Modified mountain climber,  leaning on counter with elbows and abdominals braced.  HEP.  Other HEP options were for  march and march and reach.       Knee/Hip Exercises: Supine   Straight Leg Raises Limitations over bolster 5 to 10 x 2-3 sets,  HEP  (Modified)                PT Education - 12/02/15 1312    Education provided Yes   Education Details home exercises   Person(s) Educated Patient   Methods Explanation   Comprehension Verbalized understanding          PT Short Term Goals - 11/29/15 0902      PT SHORT TERM GOAL #1   Title I with HEP   Baseline no cues needed   Status Achieved     PT SHORT TERM GOAL #2   Title Pt is I with usage of ICE and HEP but chooses to not ice   Status Achieved     PT SHORT TERM GOAL #3   Title pt will be able to sit / stand/ walk for >/= 10 min with </= 5/10 pain to promote functional progression (10/31/2015)   Status Achieved           PT Long Term Goals - 12/02/15 1315      PT LONG TERM GOAL #1   Title pt  will be I with all advanced HEP given throughtout therapy (11/21/2015)   Time 6   Period Weeks   Status On-going     PT LONG TERM GOAL #2   Title pt will improve R knee arom to >/= 110 degrees of flexion and extension to 0 degrees with </= 3/10 painto assist with function and efficent gait pattern (11/21/2015); AROM 126 degrees with pain  at 3/10   Time 6   Period Weeks   Status Achieved     PT LONG TERM GOAL #3   Title pt will improve bil LE strength to >/= 4/5 with </= 3/10 pain to assist with functional mobility and safety during walking/ standing (11/21/2015)   Time 6   Period Weeks   Status On-going     PT LONG TERM GOAL #4   Title pt will be able to sit/ stand/ walk >/= 20 min and navigate up/ down >/= 10 steps with 1 HHA using railing assist with </= 3/10 pain to promote funtional endurance required for school and ADLs (11/21/2015)   Time 6   Period Weeks   Status On-going     PT LONG TERM GOAL #5   Title Increase FOTO score to </= 46% limited to demonstrate improvement in function (11/21/2015)   Time 6   Period Weeks   Status Unable to assess     PT LONG  TERM GOAL #6   Title Pt will be able to transfer in/out of tub with 75% less difficulty   Baseline not doing yet   Time 6   Period Weeks   Status On-going     PT LONG TERM GOAL #7   Title Pt will be able to perform 20 SLR without compensation and improved eccentric descent 100% of the time   Baseline unable   Time 6   Period Weeks   Status On-going               Plan - 12/02/15 1313    Clinical Impression Statement Progress toward home exercise goal.  and working toward LTG # 3  today.  Patient declined the need for modalities at end of session.   PT Next Visit Plan continue SLR exercises and stepups   PT Home Exercise Plan modified SLR, mountain climbers, march, march and reach,  walking program   Consulted and Agree with Plan of Care Patient      Patient will benefit from skilled therapeutic intervention in order to improve the following deficits and impairments:  Abnormal gait, Decreased activity tolerance, Decreased endurance, Decreased balance, Pain, Improper body mechanics, Postural dysfunction, Increased fascial restricitons, Difficulty walking, Decreased range of motion, Hypomobility, Increased edema, Decreased strength  Visit Diagnosis: Pain in right knee  Stiffness of right knee, not elsewhere classified  Muscle weakness (generalized)  Pain in left knee  Other abnormalities of gait and mobility     Problem List Patient Active Problem List   Diagnosis Date Noted  . Intercostal pain 05/08/2015  . GERD (gastroesophageal reflux disease) 04/03/2015  . Internal hemorrhoid 09/03/2014  . Urge incontinence 12/13/2013  . Tension headache 09/25/2013  . Morbid obesity with BMI of 45.0-49.9, adult (HCC) 05/15/2013  . Vitamin D deficiency 08/05/2012  . Asthma, chronic 07/28/2012  . Arthritis 07/28/2012  . Health maintenance examination 07/28/2012    [facility] 12/02/2015, 1:18 PM  [facility] [address] [place], [place], [address] Phone: [phone]   Fax:  [phone]  Name: Cassidy [name] MRN:  [id number] Date of Birth: [birth year]   Liz BeachKaren Jalloh, PTA 12/02/15 1:18 PM Phone: [phone] Fax: [phone]

## 2015-12-03 ENCOUNTER — Other Ambulatory Visit: Payer: Self-pay | Admitting: Internal Medicine

## 2015-12-04 ENCOUNTER — Ambulatory Visit: Payer: Medicare Other | Admitting: Physical Therapy

## 2015-12-04 DIAGNOSIS — M25661 Stiffness of right knee, not elsewhere classified: Secondary | ICD-10-CM

## 2015-12-04 DIAGNOSIS — M25562 Pain in left knee: Secondary | ICD-10-CM | POA: Diagnosis not present

## 2015-12-04 DIAGNOSIS — M25561 Pain in right knee: Secondary | ICD-10-CM

## 2015-12-04 DIAGNOSIS — R2689 Other abnormalities of gait and mobility: Secondary | ICD-10-CM

## 2015-12-04 DIAGNOSIS — M6281 Muscle weakness (generalized): Secondary | ICD-10-CM

## 2015-12-04 NOTE — Therapy (Signed)
[facility] [address] [place], [place], [address] Phone: [phone]   Fax:  [phone]  Physical Therapy Treatment  Patient Details  Name: Cassidy [name] MRN: [id number] Date of Birth: March 21, 1970 Referring Provider: Tyson Alias MD  Encounter Date: 12/04/2015      PT End of Session - 12/04/15 1232    Visit Number 12   Number of Visits 14   Date for PT Re-Evaluation 12/27/15   Authorization Type medicare   PT Start Time 1147   PT Stop Time 1228   PT Time Calculation (min) 41 min   Activity Tolerance Patient tolerated treatment well   Behavior During Therapy [facility] for tasks assessed/performed      Past Medical History:  Diagnosis Date  . Arthritis   . Asthma   . Morbid obesity with BMI of 45.0-49.9, adult (HCC) 05/15/2013    Past Surgical History:  Procedure Laterality Date  . CHOLECYSTECTOMY  1998  . RECTAL PROLAPSE REPAIR, RECTOPEXY  2007   Uterine and rectal prolapse repair.  . TUBAL LIGATION Bilateral 2005    There were no vitals filed for this visit.      Subjective Assessment - 12/04/15 1151    Subjective "I am doing so far so good"    Currently in Pain? Yes   Pain Score 3    Pain Orientation Right;Left   Pain Descriptors / Indicators Sore   Pain Type Chronic pain   Pain Onset More than a month ago   Pain Frequency Constant                         [facility] Adult PT Treatment/Exercise - 12/04/15 0001      Knee/Hip Exercises: Aerobic   Nustep L5 x 10 min  working on endurance     Knee/Hip Exercises: Standing   Other Standing Knee Exercises staggered standing rocking with hip hiking 2 x 10 perofrmed bil  for hip strengthening and pt goal of dancing   Other Standing Knee Exercises alternating toe taps on 6 inch step in // 2 x 10  verbal cues to avoid hip drop      Knee/Hip Exercises: Supine   Short Arc Quad Sets AROM;2 sets;10 reps;Right   Short Arc Quad Sets Limitations 5  x 10 sec hold with ball squeeze performed bil                 PT Education - 12/04/15 1242    Education provided Yes   Education Details discussed benefits of increasing reps of exercises not only to progress strenght to improve endurance to promote prolong walking/ standing activities.    Person(s) Educated Patient   Methods Explanation;Verbal cues   Comprehension Verbalized understanding;Verbal cues required          PT Short Term Goals - 11/29/15 0902      PT SHORT TERM GOAL #1   Title I with HEP   Baseline no cues needed   Status Achieved     PT SHORT TERM GOAL #2   Title Pt is I with usage of ICE and HEP but chooses to not ice   Status Achieved     PT SHORT TERM GOAL #3   Title pt will be able to sit / stand/ walk for >/= 10 min with </= 5/10 pain to promote functional progression (10/31/2015)   Status Achieved           PT Long Term Goals -  12/02/15 1315      PT LONG TERM GOAL #1   Title pt will be I with all advanced HEP given throughtout therapy (11/21/2015)   Time 6   Period Weeks   Status On-going     PT LONG TERM GOAL #2   Title pt will improve R knee arom to >/= 110 degrees of flexion and extension to 0 degrees with </= 3/10 painto assist with function and efficent gait pattern (11/21/2015); AROM 126 degrees with pain  at 3/10   Time 6   Period Weeks   Status Achieved     PT LONG TERM GOAL #3   Title pt will improve bil LE strength to >/= 4/5 with </= 3/10 pain to assist with functional mobility and safety during walking/ standing (11/21/2015)   Time 6   Period Weeks   Status On-going     PT LONG TERM GOAL #4   Title pt will be able to sit/ stand/ walk >/= 20 min and navigate up/ down >/= 10 steps with 1 HHA using railing assist with </= 3/10 pain to promote funtional endurance required for school and ADLs (11/21/2015)   Time 6   Period Weeks   Status On-going     PT LONG TERM GOAL #5   Title Increase FOTO score to </= 46% limited to  demonstrate improvement in function (11/21/2015)   Time 6   Period Weeks   Status Unable to assess     PT LONG TERM GOAL #6   Title Pt will be able to transfer in/out of tub with 75% less difficulty   Baseline not doing yet   Time 6   Period Weeks   Status On-going     PT LONG TERM GOAL #7   Title Pt will be able to perform 20 SLR without compensation and improved eccentric descent 100% of the time   Baseline unable   Time 6   Period Weeks   Status On-going               Plan - 12/04/15 1233    Clinical Impression Statement Mrs. Exton continues to progress with pt reporting pain only at 3/10. conitnued working on quad strengthing progressing to [facility] activies in the //. pt reported that the muscles felt like they worked but she didn't have increased pain.    PT Next Visit Plan continue SLR exercises and stepups, staggered step lateral stepping, if no script was sent back from MD plan to give pt updated script to get signed by MD before her next  visit.    Consulted and Agree with Plan of Care Patient      Patient will benefit from skilled therapeutic intervention in order to improve the following deficits and impairments:  Abnormal gait, Decreased activity tolerance, Decreased endurance, Decreased balance, Pain, Improper body mechanics, Postural dysfunction, Increased fascial restricitons, Difficulty walking, Decreased range of motion, Hypomobility, Increased edema, Decreased strength  Visit Diagnosis: Pain in right knee  Stiffness of right knee, not elsewhere classified  Muscle weakness (generalized)  Pain in left knee  Other abnormalities of gait and mobility     Problem List Patient Active Problem List   Diagnosis Date Noted  . Intercostal pain 05/08/2015  . GERD (gastroesophageal reflux disease) 04/03/2015  . Internal hemorrhoid 09/03/2014  . Urge incontinence 12/13/2013  . Tension headache 09/25/2013  . Morbid obesity with BMI of 45.0-49.9, adult (HCC)  05/15/2013  . Vitamin D deficiency 08/05/2012  . Asthma, chronic 07/28/2012  .  Arthritis 07/28/2012  . Health maintenance examination 07/28/2012   Lulu RidingKristoffer Zonie Crutcher PT, DPT, LAT, ATC  12/04/15  12:46 PM      [facility] [address] [place], [place], [address] Phone: [phone]   Fax:  [phone]  Name: [name] Sessionisha [place] MRN: [id number] Date of Birth: October 30, 1970

## 2015-12-08 ENCOUNTER — Other Ambulatory Visit: Payer: Self-pay | Admitting: Internal Medicine

## 2015-12-08 DIAGNOSIS — K219 Gastro-esophageal reflux disease without esophagitis: Secondary | ICD-10-CM

## 2015-12-09 ENCOUNTER — Ambulatory Visit: Payer: Medicare Other | Admitting: Physical Therapy

## 2015-12-10 ENCOUNTER — Ambulatory Visit: Payer: Medicare Other | Admitting: Physical Therapy

## 2015-12-10 DIAGNOSIS — M25661 Stiffness of right knee, not elsewhere classified: Secondary | ICD-10-CM

## 2015-12-10 DIAGNOSIS — M6281 Muscle weakness (generalized): Secondary | ICD-10-CM

## 2015-12-10 DIAGNOSIS — M25562 Pain in left knee: Secondary | ICD-10-CM

## 2015-12-10 DIAGNOSIS — R2689 Other abnormalities of gait and mobility: Secondary | ICD-10-CM

## 2015-12-10 DIAGNOSIS — M25561 Pain in right knee: Secondary | ICD-10-CM | POA: Diagnosis not present

## 2015-12-10 NOTE — Therapy (Signed)
[facility] [address] [place], [place], [address] Phone: [phone]   Fax:  [phone]  Physical Therapy Treatment  Patient Details  Name: Cassidy [name] MRN: [id number] Date of Birth: Jul 24, 1970 Referring Provider: Tyson Aliasuncan Thomas Vincent MD  Encounter Date: 12/10/2015      PT End of Session - 12/10/15 1825    Visit Number 13   Number of Visits 14   Date for PT Re-Evaluation 12/27/15   PT Start Time 1419   PT Stop Time 1500   PT Time Calculation (min) 41 min   Activity Tolerance Patient tolerated treatment well   Behavior During Therapy [facility] for tasks assessed/performed      Past Medical History:  Diagnosis Date  . Arthritis   . Asthma   . Morbid obesity with BMI of 45.0-49.9, adult (HCC) 05/15/2013    Past Surgical History:  Procedure Laterality Date  . CHOLECYSTECTOMY  1998  . RECTAL PROLAPSE REPAIR, RECTOPEXY  2007   Uterine and rectal prolapse repair.  . TUBAL LIGATION Bilateral 2005    There were no vitals filed for this visit.      Subjective Assessment - 12/10/15 1431    Subjective 3-4/10  Has been busy,  has done her exercises   Currently in Pain? Yes   Pain Score 4    Pain Location Knee   Pain Orientation Right;Left   Pain Descriptors / Indicators Sore                         [facility] Adult PT Treatment/Exercise - 12/10/15 0001      Knee/Hip Exercises: Aerobic   Nustep L5 x 10 min  working on endurance     Knee/Hip Exercises: Standing   Heel Raises 10 reps   Hip Extension 1 set;10 reps;Both   Other Standing Knee Exercises staggered standing rocking with hip hiking 2 x 10 perofrmed bil  for hip strengthening and pt goal of dancing   Other Standing Knee Exercises dance moves modified from [employer] video. suggestions     Knee/Hip Exercises: Supine   Bridges Limitations 10 / ball   Straight Leg Raises Limitations 5 X from ball elevation increased pain to 6-7/10 so stopped                   PT Short Term Goals - 11/29/15 0902      PT SHORT TERM GOAL #1   Title I with HEP   Baseline no cues needed   Status Achieved     PT SHORT TERM GOAL #2   Title Pt is I with usage of ICE and HEP but chooses to not ice   Status Achieved     PT SHORT TERM GOAL #3   Title pt will be able to sit / stand/ walk for >/= 10 min with </= 5/10 pain to promote functional progression (10/31/2015)   Status Achieved           PT Long Term Goals - 12/10/15 1828      PT LONG TERM GOAL #1   Title pt will be I with all advanced HEP given throughtout therapy (11/21/2015)   Time 6   Period Weeks   Status On-going     PT LONG TERM GOAL #2   Title pt will improve R knee arom to >/= 110 degrees of flexion and extension to 0 degrees with </= 3/10 painto assist with function and efficent gait pattern (11/21/2015); AROM  126 degrees with pain  at 3/10   Time 6   Period Weeks   Status Achieved     PT LONG TERM GOAL #3   Title pt will improve bil LE strength to >/= 4/5 with </= 3/10 pain to assist with functional mobility and safety during walking/ standing (11/21/2015)   Time 6   Period Weeks   Status Unable to assess     PT LONG TERM GOAL #4   Title pt will be able to sit/ stand/ walk >/= 20 min and navigate up/ down >/= 10 steps with 1 HHA using railing assist with </= 3/10 pain to promote funtional endurance required for school and ADLs (11/21/2015)   Period Weeks   Status On-going     PT LONG TERM GOAL #5   Title Increase FOTO score to </= 46% limited to demonstrate improvement in function (11/21/2015)   Time 6   Period Weeks   Status Unable to assess     PT LONG TERM GOAL #6   Title Pt will be able to transfer in/out of tub with 75% less difficulty   Baseline Has been in tub a few times , but usually sits in chair for shower   Time 6   Period Weeks   Status On-going     PT LONG TERM GOAL #7   Title Pt will be able to perform 20 SLR without compensation and improved  eccentric descent 100% of the time   Baseline 5 X from elevated ball is painful 6-7/10   Time 6   Period Weeks   Status On-going               Plan - 12/10/15 1825    Clinical Impression Statement pain 5-6/10 with exercises todsay.  She arrived fatigued forgetting she had and appointment, she had already exercises and been active.  She continues to be motivated to improve ,    PT Next Visit Plan continue SLR exercises and stepups, staggered step lateral stepping, if no script was sent back from MD plan to give pt updated script to get signed by MD before her next  visit.    PT Home Exercise Plan continue   Consulted and Agree with Plan of Care Patient      Patient will benefit from skilled therapeutic intervention in order to improve the following deficits and impairments:  Abnormal gait, Decreased activity tolerance, Decreased endurance, Decreased balance, Pain, Improper body mechanics, Postural dysfunction, Increased fascial restricitons, Difficulty walking, Decreased range of motion, Hypomobility, Increased edema, Decreased strength  Visit Diagnosis: Pain in right knee  Stiffness of right knee, not elsewhere classified  Muscle weakness (generalized)  Pain in left knee  Other abnormalities of gait and mobility     Problem List Patient Active Problem List   Diagnosis Date Noted  . Intercostal pain 05/08/2015  . GERD (gastroesophageal reflux disease) 04/03/2015  . Internal hemorrhoid 09/03/2014  . Urge incontinence 12/13/2013  . Tension headache 09/25/2013  . Morbid obesity with BMI of 45.0-49.9, adult (HCC) 05/15/2013  . Vitamin D deficiency 08/05/2012  . Asthma, chronic 07/28/2012  . Arthritis 07/28/2012  . Health maintenance examination 07/28/2012    Harbison,Rosario Kushner  PTA 12/10/2015, 6:30 PM  [facility] [address] [place], [place], [address] Phone: [phone]   Fax:  [phone]  Name: Cassidy [name] MRN: [id number] Date of Birth: 1970/10/24

## 2015-12-11 ENCOUNTER — Ambulatory Visit: Payer: Medicare Other | Admitting: Physical Therapy

## 2015-12-11 DIAGNOSIS — R2689 Other abnormalities of gait and mobility: Secondary | ICD-10-CM | POA: Diagnosis not present

## 2015-12-11 DIAGNOSIS — M6281 Muscle weakness (generalized): Secondary | ICD-10-CM | POA: Diagnosis not present

## 2015-12-11 DIAGNOSIS — M25661 Stiffness of right knee, not elsewhere classified: Secondary | ICD-10-CM

## 2015-12-11 DIAGNOSIS — M25561 Pain in right knee: Secondary | ICD-10-CM | POA: Diagnosis not present

## 2015-12-11 DIAGNOSIS — M25562 Pain in left knee: Secondary | ICD-10-CM | POA: Diagnosis not present

## 2015-12-11 NOTE — Therapy (Addendum)
[place], [place], [address] Phone: [phone]   Fax:  [phone]  Physical Therapy Treatment/ Re-certification  Patient Details  Name: Cassidy [name] MRN: [id number] Date of Birth: [birth year] Referring Provider: Axel Filler MD  Encounter Date: 12/11/2015      PT End of Session - 12/11/15 1243    Visit Number 14   Number of Visits 22   Date for PT Re-Evaluation 01/08/16   Authorization Type medicare   PT Start Time 2297   PT Stop Time 1232   PT Time Calculation (min) 47 min   Activity Tolerance Patient tolerated treatment well   Behavior During Therapy [facility] for tasks assessed/performed      Past Medical History:  Diagnosis Date  . Arthritis   . Asthma   . Morbid obesity with BMI of 45.0-49.9, adult ([place]) 05/15/2013    Past Surgical History:  Procedure Laterality Date  . CHOLECYSTECTOMY  1998  . RECTAL PROLAPSE REPAIR, RECTOPEXY  2007   Uterine and rectal prolapse repair.  . TUBAL LIGATION Bilateral 2005    There were no vitals filed for this visit.      Subjective Assessment - 12/11/15 1148    Subjective "I am feeling sore today in the hips  and knee. I had to help my daughter from being knocked over by a love seat so I am sore from that "    Currently in Pain? Yes   Pain Score 5    Pain Location Knee   Pain Orientation Right;Left   Pain Type Chronic pain   Pain Onset More than a month ago   Pain Frequency Constant   Aggravating Factors  stairs, sit to stand, weight bearing   Pain Relieving Factors meidcation, rest, exercise            [facility] PT Assessment - 12/11/15 0001      Strength   Right Hip Flexion 4+/5   Right Hip Extension 3+/5   Right Hip ABduction 3+/5   Left Hip Flexion 4+/5   Left Hip Extension 4-/5   Left Hip ABduction 4-/5   Right Knee Flexion 4+/5   Right Knee Extension 4+/5   Left Knee Flexion 4+/5   Left Knee Extension 4+/5                      [facility] Adult PT Treatment/Exercise - 12/11/15 0001      Knee/Hip Exercises: Aerobic   Nustep L 6 x 8 min     Knee/Hip Exercises: Standing   Heel Raises 10 reps   Hip Abduction AROM;Stengthening;Both;2 sets;10 reps  in //   Abduction Limitations verbal / tactile cues for posterior pelvic tilt to reduce low back pain    Hip Extension 1 set;10 reps;Both  in //   Gait Training walking down stairs 4x using 4 inch step, 1 x down 6 inch step  verbal cues for proper form/ reciprocally   Other Standing Knee Exercises staggered standing rocking with hip hiking 2 x 10 perofrmed bil   Other Standing Knee Exercises dance moves modified from [place] video. suggestions     Knee/Hip Exercises: Seated   Other Seated Knee/Hip Exercises seated pelvic tilts 2 x 10   to work on getting flat back                 PT Education - 12/11/15 1240    Education provided Yes   Education Details reviewed and updated  HEP, discussed at length benefits of reducing lumbar lordosis with posterior pelvic tilt for pain and to promote more use of LE with exercsies.    Person(s) Educated Patient   Methods Explanation;Verbal cues;Tactile cues;Demonstration   Comprehension Verbalized understanding;Verbal cues required;Returned demonstration          PT Short Term Goals - 11/29/15 0902      PT SHORT TERM GOAL #1   Title I with HEP   Baseline no cues needed   Status Achieved     PT SHORT TERM GOAL #2   Title Pt is I with usage of ICE and HEP but chooses to not ice   Status Achieved     PT SHORT TERM GOAL #3   Title pt will be able to sit / stand/ walk for >/= 10 min with </= 5/10 pain to promote functional progression (10/31/2015)   Status Achieved           PT Long Term Goals - 12/11/15 1246      PT LONG TERM GOAL #1   Title pt will be I with all advanced HEP given throughtout therapy (11/21/2015)   Time 6   Period Weeks   Status On-going     PT LONG TERM GOAL #2    Title pt will improve R knee arom to >/= 110 degrees of flexion and extension to 0 degrees with </= 3/10 painto assist with function and efficent gait pattern (11/21/2015); AROM 126 degrees with pain  at 3/10   Time 6   Period Weeks   Status Achieved     PT LONG TERM GOAL #3   Title pt will improve bil LE strength to >/= 4/5 with </= 3/10 pain to assist with functional mobility and safety during walking/ standing (11/21/2015)   Time 6   Period Weeks   Status Partially Met     PT LONG TERM GOAL #4   Title pt will be able to sit/ stand/ walk >/= 20 min and navigate up/ down >/= 10 steps with 1 HHA using railing assist with </= 3/10 pain to promote funtional endurance required for school and ADLs (11/21/2015)   Time 6   Period Weeks   Status Partially Met     PT LONG TERM GOAL #5   Title Increase FOTO score to </= 46% limited to demonstrate improvement in function (11/21/2015)   Time 6   Period Weeks   Status On-going     PT LONG TERM GOAL #6   Title Pt will be able to transfer in/out of tub with 75% less difficulty   Time 6   Period Weeks   Status On-going     PT LONG TERM GOAL #7   Title Pt will be able to perform 20 SLR without compensation and improved eccentric descent 100% of the time   Time 6   Period Weeks   Status On-going               Plan - 12/11/15 1244    Clinical Impression Statement Mrs. wake reports increased soreness today. continued strengthening of bil hips and knees as well as worked on IT trainer with focus on reciprocal activities, and functional activites. She is progressing with goals, plan to conitnue with current POC to work toward remaining goals.    PT Next Visit Plan continue SLR exercises and stepups, staggered step lateral stepping, functional activities,       Patient will benefit from skilled therapeutic intervention in order to  improve the following deficits and impairments:  Abnormal gait, Decreased activity tolerance, Decreased  endurance, Decreased balance, Pain, Improper body mechanics, Postural dysfunction, Increased fascial restricitons, Difficulty walking, Decreased range of motion, Hypomobility, Increased edema, Decreased strength  Visit Diagnosis: Pain in right knee - Plan: PT plan of care cert/re-cert  Muscle weakness (generalized) - Plan: PT plan of care cert/re-cert  Stiffness of right knee, not elsewhere classified - Plan: PT plan of care cert/re-cert  Pain in left knee - Plan: PT plan of care cert/re-cert  Other abnormalities of gait and mobility - Plan: PT plan of care cert/re-cert     Problem List Patient Active Problem List   Diagnosis Date Noted  . Intercostal pain 05/08/2015  . GERD (gastroesophageal reflux disease) 04/03/2015  . Internal hemorrhoid 09/03/2014  . Urge incontinence 12/13/2013  . Tension headache 09/25/2013  . Morbid obesity with BMI of 45.0-49.9, adult ([place]) 05/15/2013  . Vitamin D deficiency 08/05/2012  . Asthma, chronic 07/28/2012  . Arthritis 07/28/2012  . Health maintenance examination 07/28/2012   Starr Lake PT, DPT, LAT, ATC  12/11/15  12:51 PM      [facility] [address] [place], [place], [address] Phone: [phone]   Fax:  [phone]  Name: Cassidy [name] MRN: [id number] Date of Birth: 1970/05/02

## 2015-12-12 ENCOUNTER — Telehealth: Payer: Self-pay | Admitting: Internal Medicine

## 2015-12-12 NOTE — Telephone Encounter (Signed)
APT. REMINDER CALL, LMTCB °

## 2015-12-13 ENCOUNTER — Encounter: Payer: Self-pay | Admitting: Internal Medicine

## 2015-12-13 ENCOUNTER — Ambulatory Visit (INDEPENDENT_AMBULATORY_CARE_PROVIDER_SITE_OTHER): Payer: Medicare Other | Admitting: Internal Medicine

## 2015-12-13 DIAGNOSIS — M25561 Pain in right knee: Secondary | ICD-10-CM

## 2015-12-13 DIAGNOSIS — M549 Dorsalgia, unspecified: Secondary | ICD-10-CM

## 2015-12-13 DIAGNOSIS — G8929 Other chronic pain: Secondary | ICD-10-CM | POA: Diagnosis not present

## 2015-12-13 DIAGNOSIS — M064 Inflammatory polyarthropathy: Secondary | ICD-10-CM

## 2015-12-13 DIAGNOSIS — M25562 Pain in left knee: Secondary | ICD-10-CM | POA: Diagnosis not present

## 2015-12-13 DIAGNOSIS — M25531 Pain in right wrist: Secondary | ICD-10-CM

## 2015-12-13 DIAGNOSIS — Z6841 Body Mass Index (BMI) 40.0 and over, adult: Secondary | ICD-10-CM | POA: Diagnosis not present

## 2015-12-13 DIAGNOSIS — M19042 Primary osteoarthritis, left hand: Secondary | ICD-10-CM

## 2015-12-13 DIAGNOSIS — M19041 Primary osteoarthritis, right hand: Secondary | ICD-10-CM | POA: Insufficient documentation

## 2015-12-13 MED ORDER — METHYLPREDNISOLONE 4 MG PO TBPK: ORAL_TABLET | ORAL | 0 refills | Status: DC

## 2015-12-13 NOTE — Progress Notes (Signed)
   CC: Right wrist pain  HPI:  [name] is a 45 y.o. woman with a history of obesity, asthma, and arthritis presenting for an 8 week follow up of her knee pain but now with symptoms of right wrist pain. This started about 2 days ago after waking up with severe tenderness to palpation and range of motion at the radial aspect of her wrist. She also feels is it somewhat swollen. There was no known trauma or other precipitating event before this pain started. She has had a similar type of pain before in her left wrist months ago. Her knee pain is actually improved a lot after 8 weeks of physical therapy for strength and gait training.  See problem based assessment and plan below for additional details.  Past Medical History:  Diagnosis Date  . Arthritis   . Asthma   . Morbid obesity with BMI of 45.0-49.9, adult ([facility]) 05/15/2013    Review of Systems:  Review of Systems  Constitutional: Negative for fever.  Respiratory: Negative for shortness of breath.   Cardiovascular: Negative for chest pain and leg swelling.  Musculoskeletal: Positive for back pain and joint pain. Negative for falls.  Skin: Negative for rash.  Neurological: Negative for sensory change and weakness.     Physical Exam:  Vitals:   12/13/15 1428  BP: 111/71  Pulse: 68  Temp: 98.3 F (36.8 C)  TempSrc: Oral  SpO2: 100%  Weight: (!) 314 lb 4.8 oz (142.6 kg)  Height: 5\' 6"  (1.676 m)   GENERAL- alert, co-operative, NAD HEENT- Atraumatic, PERRL, EOMI, oral mucosa appears moist CARDIAC- RRR, no murmurs, rubs or gallops. RESP- CTAB, no wheezes or crackles. NEURO- ROM of right wrist severely restricted due to pain EXTREMITIES- no pedal edema, wrist wrist very tender to palpation around 1st MCP, pain on wrist flexion, ulnar deviation and radial deviation, some fullness but no clear effusion, no erythema, no warmth SKIN- Warm, dry, No rash or lesion.   Assessment & Plan:   See Encounters Tab for problem based  charting.  Patient discussed with Dr. Criselda PeachesMullen

## 2015-12-13 NOTE — Patient Instructions (Addendum)
For your wrist I recommend taking a steroid dose pack for the next 6 days to reduce the pain and inflammation. You may be suffering from an atypical gout flare or other form of an inflammatory arthritis, rather than just wear and tear. We will check some blood test markers today looking for such processes.  I would like to see you again soon if the pain comes right back after treatment.

## 2015-12-14 LAB — CBC
HEMATOCRIT: 35.6 % (ref 34.0–46.6)
Hemoglobin: 11.7 g/dL (ref 11.1–15.9)
MCH: 27.4 pg (ref 26.6–33.0)
MCHC: 32.9 g/dL (ref 31.5–35.7)
MCV: 83 fL (ref 79–97)
Platelets: 353 10*3/uL (ref 150–379)
RBC: 4.27 x10E6/uL (ref 3.77–5.28)
RDW: 15.3 % (ref 12.3–15.4)
WBC: 8 10*3/uL (ref 3.4–10.8)

## 2015-12-14 LAB — BMP8+ANION GAP
ANION GAP: 13 mmol/L (ref 10.0–18.0)
BUN/Creatinine Ratio: 8 — ABNORMAL LOW (ref 9–23)
BUN: 7 mg/dL (ref 6–24)
CO2: 24 mmol/L (ref 18–29)
CREATININE: 0.91 mg/dL (ref 0.57–1.00)
Calcium: 9.2 mg/dL (ref 8.7–10.2)
Chloride: 104 mmol/L (ref 96–106)
GFR calc non Af Amer: 78 mL/min/{1.73_m2} (ref >59)
GFR, EST AFRICAN AMERICAN: 90 mL/min/{1.73_m2} (ref >59)
Glucose: 88 mg/dL (ref 65–99)
Potassium: 4.2 mmol/L (ref 3.5–5.2)
Sodium: 141 mmol/L (ref 134–144)

## 2015-12-14 LAB — SEDIMENTATION RATE: SED RATE: 10 mm/h (ref 0–32)

## 2015-12-16 NOTE — Assessment & Plan Note (Addendum)
A: Ms. Hauser now has new swelling and pain in the right wrist despite overall an improvement in her back and bilateral leg pains. She has had this type of pain episodically in both wrists at different times but never symmetrically. Early osteoarthritis of the wrists is unlikely from obesity alone since they are not weight bearing joints. Other possibilities include CPPD or gout, the latter of which could be triggered by her ongoing heavy use of NSAIDs due to chronic pain.  P: -Checking ESR and metabolic panels today, plus CBC -Prescribed medrol dose pak-- #21 4mg tablets total over 6 days -Instructed to continue light activity of the affected hand -If studies return negative and her symptoms persist, can consider plain film of the hand to check for increase severity of degenerative disease or for chondrocalcinosis. 

## 2015-12-24 NOTE — Progress Notes (Signed)
Internal Medicine Clinic Attending  Case discussed with Dr. Rice soon after the resident saw the patient.  We reviewed the resident's history and exam and pertinent patient test results.  I agree with the assessment, diagnosis, and plan of care documented in the resident's note. 

## 2015-12-31 ENCOUNTER — Ambulatory Visit: Payer: Medicare Other | Admitting: Physical Therapy

## 2016-01-01 ENCOUNTER — Ambulatory Visit: Payer: Medicare Other | Attending: Internal Medicine | Admitting: Physical Therapy

## 2016-01-01 DIAGNOSIS — M25561 Pain in right knee: Secondary | ICD-10-CM | POA: Diagnosis not present

## 2016-01-01 DIAGNOSIS — G8929 Other chronic pain: Secondary | ICD-10-CM | POA: Diagnosis not present

## 2016-01-01 DIAGNOSIS — M25661 Stiffness of right knee, not elsewhere classified: Secondary | ICD-10-CM | POA: Insufficient documentation

## 2016-01-01 DIAGNOSIS — R2689 Other abnormalities of gait and mobility: Secondary | ICD-10-CM | POA: Diagnosis not present

## 2016-01-01 DIAGNOSIS — M6281 Muscle weakness (generalized): Secondary | ICD-10-CM | POA: Diagnosis not present

## 2016-01-01 DIAGNOSIS — M25562 Pain in left knee: Secondary | ICD-10-CM | POA: Diagnosis not present

## 2016-01-01 NOTE — Therapy (Signed)
[place], [place], [address] Phone: [phone]   Fax:  [phone]  Physical Therapy Treatment  Patient Details  Name: Cassidy [name] MRN: [id number] Date of Birth: [birth year] Referring Provider: Axel Filler MD  Encounter Date: 01/01/2016      PT End of Session - 01/01/16 1246    Visit Number 15   Number of Visits 22   Date for PT Re-Evaluation 01/08/16   PT Start Time 1103   PT Stop Time 1147   PT Time Calculation (min) 44 min   Activity Tolerance Patient tolerated treatment well   Behavior During Therapy [facility] for tasks assessed/performed      Past Medical History:  Diagnosis Date  . Arthritis   . Asthma   . Morbid obesity with BMI of 45.0-49.9, adult ([place]) 05/15/2013    Past Surgical History:  Procedure Laterality Date  . CHOLECYSTECTOMY  1998  . RECTAL PROLAPSE REPAIR, RECTOPEXY  2007   Uterine and rectal prolapse repair.  . TUBAL LIGATION Bilateral 2005    There were no vitals filed for this visit.      Subjective Assessment - 01/01/16 1106    Subjective " feeling sore, but it isn't as limiting as it used to be"    Currently in Pain? Yes   Pain Score 4    Pain Location Knee   Pain Orientation Right;Left   Pain Type Chronic pain   Pain Onset More than a month ago   Pain Frequency Intermittent   Aggravating Factors  stairs, sit to stand,    Pain Relieving Factors medication, rest, exercise                          [facility] Adult PT Treatment/Exercise - 01/01/16 1115      Knee/Hip Exercises: Aerobic   Nustep L 6 x 5 min  LE only     Knee/Hip Exercises: Standing   Gait Training walking down stairs 4x using 6 inch step, 1 x down 6 inch step   Other Standing Knee Exercises staggered standing rocking with hip hiking 2 x 10 perofrmed bil  with reaching   Other Standing Knee Exercises alternating toe taps on top of cone in // 2 x 10  verbal/ visual cues to  avoid using hands and keep core tight     Knee/Hip Exercises: Seated   Other Seated Knee/Hip Exercises seated pelvic tilts 2 x 10   focus on poster tilt to get flattened back                PT Education - 01/01/16 1247    Education provided Yes   Education Details Reviewed HEP, discussed slow progression of walking to avoid DOMS and working up to improve endurance. provided pt with script to take to MD to get signed for bil knee/ patellar stabilizer braces.    Person(s) Educated Patient   Methods Explanation;Verbal cues   Comprehension Verbalized understanding;Verbal cues required          PT Short Term Goals - 11/29/15 0902      PT SHORT TERM GOAL #1   Title I with HEP   Baseline no cues needed   Status Achieved     PT SHORT TERM GOAL #2   Title Pt is I with usage of ICE and HEP but chooses to not ice   Status Achieved     PT SHORT TERM GOAL #3  Title pt will be able to sit / stand/ walk for >/= 10 min with </= 5/10 pain to promote functional progression (10/31/2015)   Status Achieved           PT Long Term Goals - 12/11/15 1246      PT LONG TERM GOAL #1   Title pt will be I with all advanced HEP given throughtout therapy (11/21/2015)   Time 6   Period Weeks   Status On-going     PT LONG TERM GOAL #2   Title pt will improve R knee arom to >/= 110 degrees of flexion and extension to 0 degrees with </= 3/10 painto assist with function and efficent gait pattern (11/21/2015); AROM 126 degrees with pain  at 3/10   Time 6   Period Weeks   Status Achieved     PT LONG TERM GOAL #3   Title pt will improve bil LE strength to >/= 4/5 with </= 3/10 pain to assist with functional mobility and safety during walking/ standing (11/21/2015)   Time 6   Period Weeks   Status Partially Met     PT LONG TERM GOAL #4   Title pt will be able to sit/ stand/ walk >/= 20 min and navigate up/ down >/= 10 steps with 1 HHA using railing assist with </= 3/10 pain to promote funtional  endurance required for school and ADLs (11/21/2015)   Time 6   Period Weeks   Status Partially Met     PT LONG TERM GOAL #5   Title Increase FOTO score to </= 46% limited to demonstrate improvement in function (11/21/2015)   Time 6   Period Weeks   Status On-going     PT LONG TERM GOAL #6   Title Pt will be able to transfer in/out of tub with 75% less difficulty   Time 6   Period Weeks   Status On-going     PT LONG TERM GOAL #7   Title Pt will be able to perform 20 SLR without compensation and improved eccentric descent 100% of the time   Time 6   Period Weeks   Status On-going               Plan - 01/01/16 1248    Clinical Impression Statement [name] report she is doing well and being consistent with her HEP. focused on hip strengthening with reaching and alternating toe taps to continue to work on dynamic glute med strengthening and balance training. she requires verbal cues to work on posture due to increased lumbar lordosis but is able to reduce in sitting with report of decreased soreness, but has difficulty dissociating her low back and her hips.  plan to continue for 2-3 more visits and discharge from PT   Rehab Potential Good   PT Next Visit Plan continue SLR exercises and stepups, staggered step lateral stepping, functional activities, pt reported difficulty of getting into/ out of tub Modification?   PT Home Exercise Plan alternating toe taps on step   Consulted and Agree with Plan of Care Patient      Patient will benefit from skilled therapeutic intervention in order to improve the following deficits and impairments:  Abnormal gait, Decreased activity tolerance, Decreased endurance, Decreased balance, Pain, Improper body mechanics, Postural dysfunction, Increased fascial restricitons, Difficulty walking, Decreased range of motion, Hypomobility, Increased edema, Decreased strength  Visit Diagnosis: Chronic pain of right knee  Muscle weakness  (generalized)  Stiffness of right knee, not  elsewhere classified  Chronic pain of left knee  Other abnormalities of gait and mobility     Problem List Patient Active Problem List   Diagnosis Date Noted  . Inflammatory arthritis 12/13/2015  . Intercostal pain 05/08/2015  . GERD (gastroesophageal reflux disease) 04/03/2015  . Internal hemorrhoid 09/03/2014  . Urge incontinence 12/13/2013  . Tension headache 09/25/2013  . Morbid obesity with BMI of 45.0-49.9, adult ([place]) 05/15/2013  . Vitamin D deficiency 08/05/2012  . Asthma, chronic 07/28/2012  . Arthritis 07/28/2012  . Health maintenance examination 07/28/2012   Starr Lake PT, DPT, LAT, ATC  01/01/16  12:56 PM      [facility] [address] [place], [place], [address] Phone: [phone]   Fax:  [phone]  Name: Cassidy [name] MRN: [id number] Date of Birth: October 18, 1970

## 2016-01-02 ENCOUNTER — Ambulatory Visit: Payer: Medicare Other | Admitting: Physical Therapy

## 2016-01-06 ENCOUNTER — Other Ambulatory Visit: Payer: Self-pay | Admitting: Internal Medicine

## 2016-01-07 ENCOUNTER — Ambulatory Visit: Payer: Medicare Other | Admitting: Physical Therapy

## 2016-01-07 DIAGNOSIS — M25661 Stiffness of right knee, not elsewhere classified: Secondary | ICD-10-CM

## 2016-01-07 DIAGNOSIS — G8929 Other chronic pain: Secondary | ICD-10-CM

## 2016-01-07 DIAGNOSIS — M25561 Pain in right knee: Principal | ICD-10-CM

## 2016-01-07 DIAGNOSIS — M6281 Muscle weakness (generalized): Secondary | ICD-10-CM | POA: Diagnosis not present

## 2016-01-07 DIAGNOSIS — R2689 Other abnormalities of gait and mobility: Secondary | ICD-10-CM | POA: Diagnosis not present

## 2016-01-07 DIAGNOSIS — M25562 Pain in left knee: Secondary | ICD-10-CM

## 2016-01-07 NOTE — Therapy (Signed)
[place], [place], [address] Phone: [phone]   Fax:  [phone]  Physical Therapy Treatment  Patient Details  Name: Cassidy [name] MRN: [id number] Date of Birth: 1970-04-29 Referring Provider: Axel Filler MD  Encounter Date: 01/07/2016      PT End of Session - 01/07/16 1645    Visit Number 16   Number of Visits 22   Date for PT Re-Evaluation 01/08/16   PT Start Time 1332   PT Stop Time 1415   PT Time Calculation (min) 43 min   Activity Tolerance Patient tolerated treatment well   Behavior During Therapy [facility] for tasks assessed/performed      Past Medical History:  Diagnosis Date  . Arthritis   . Asthma   . Morbid obesity with BMI of 45.0-49.9, adult ([place]) 05/15/2013    Past Surgical History:  Procedure Laterality Date  . CHOLECYSTECTOMY  1998  . RECTAL PROLAPSE REPAIR, RECTOPEXY  2007   Uterine and rectal prolapse repair.  . TUBAL LIGATION Bilateral 2005    There were no vitals filed for this visit.      Subjective Assessment - 01/07/16 1341    Subjective Pain  5/10 left knee.  No mor pain than usual.   Patient is accompained by: Family member   Currently in Pain? Yes   Pain Score 5    Pain Location Knee   Pain Orientation Right;Left   Pain Descriptors / Indicators --  pain   Pain Frequency Constant   Aggravating Factors  sit to stand , walking, exercises   Pain Relieving Factors medication , rest ,     Effect of Pain on Daily Activities limits walking   Multiple Pain Sites No                         [facility] Adult PT Treatment/Exercise - 01/07/16 0001      Self-Care   Self-Care Other Self-Care Comments   Other Self-Care Comments  Discussed plan of care and exercise at home.  Patient did not feel she knew exactly what to do for home exercises and how to progress. She did not feel ready for discharge.  Tub transfers discussed with many options available.   It was decided grab bars would work   She declined the use of a tub bench.  Fiance helps if there     Lumbar Exercises: Standing   Heel Raises 20 reps  with toe lifts     Knee/Hip Exercises: Aerobic   Nustep L 6 x 5 min  LE only     Knee/Hip Exercises: Standing   Forward Step Up Both;1 set;10 reps;Hand Hold: 2;Step Height: 2";Limitations   Forward Step Up Limitations painful 5-6/10,  cues for technique     Knee/Hip Exercises: Seated   Heel Slides Both  sliding pillowcase     Knee/Hip Exercises: Supine   Quad Sets 1 set;10 reps  left needed a small roll under knee   Short Arc Quad Sets 10 reps;2 sets  slow painful   Heel Slides Limitations 3X AA painful   Other Supine Knee/Hip Exercises Hamstring curls 10 x 3 sets , green band, both     Manual Therapy   Manual Therapy Edema management   Manual therapy comments patient tearful with manual   Edema Management retrograde soft tissue work.  Anterior thighs.  Tissue congested very tender,  which softened and became less tender.  PT Education - 01/07/16 1643    Education provided Yes   Education Details Discharge planning and how to progress exercises,  how to get them done when not attending PT.  Grab bars and assist for tub transfers.   Person(s) Educated Patient;Other (comment)  Fiance.   Methods Explanation   Comprehension Verbalized understanding          PT Short Term Goals - 11/29/15 0902      PT SHORT TERM GOAL #1   Title I with HEP   Baseline no cues needed   Status Achieved     PT SHORT TERM GOAL #2   Title Pt is I with usage of ICE and HEP but chooses to not ice   Status Achieved     PT SHORT TERM GOAL #3   Title pt will be able to sit / stand/ walk for >/= 10 min with </= 5/10 pain to promote functional progression (10/31/2015)   Status Achieved           PT Long Term Goals - 01/07/16 1651      PT LONG TERM GOAL #1   Title pt will be I with all advanced HEP given  throughtout therapy (11/21/2015)   Baseline Unsure about HEP   Time 6   Period Weeks   Status On-going     PT LONG TERM GOAL #2   Title pt will improve R knee arom to >/= 110 degrees of flexion and extension to 0 degrees with </= 3/10 painto assist with function and efficent gait pattern (11/21/2015); AROM 126 degrees with pain  at 3/10   Status Achieved     PT LONG TERM GOAL #3   Title pt will improve bil LE strength to >/= 4/5 with </= 3/10 pain to assist with functional mobility and safety during walking/ standing (11/21/2015)   Baseline Pain with functional mobility 5/10 today   Time 6   Period Weeks   Status Partially Met     PT LONG TERM GOAL #4   Title pt will be able to sit/ stand/ walk >/= 20 min and navigate up/ down >/= 10 steps with 1 HHA using railing assist with </= 3/10 pain to promote funtional endurance required for school and ADLs (11/21/2015)   Time 6   Period Weeks   Status Unable to assess     PT LONG TERM GOAL #5   Title Increase FOTO score to </= 46% limited to demonstrate improvement in function (11/21/2015)   Time 6   Period Weeks   Status On-going     PT LONG TERM GOAL #6   Title Pt will be able to transfer in/out of tub with 75% less difficulty   Baseline Still difficult, discussed stratigies today   Time 6   Period Weeks   Status On-going     PT LONG TERM GOAL #7   Title Pt will be able to perform 20 SLR without compensation and improved eccentric descent 100% of the time   Time 6   Status Unable to assess               Plan - 01/07/16 1646    Clinical Impression Statement Lower level exercises tolerated today due to pain with decreased activity tolerance demonstrated.  Legs were congested and sore again . (They had not been working on this at home) She was at a loss with what to do for exercise post PT.  No new goals met today.  PT Next Visit Plan ERO next visit.  Will discuss plan with PT.    PT Home Exercise Plan alternating toe taps on  step. (01/07/16) No new exercises, but go back to initial HEP for easier exercises.    Consulted and Agree with Plan of Care Patient  Fiance      Patient will benefit from skilled therapeutic intervention in order to improve the following deficits and impairments:  Abnormal gait, Decreased activity tolerance, Decreased endurance, Decreased balance, Pain, Improper body mechanics, Postural dysfunction, Increased fascial restricitons, Difficulty walking, Decreased range of motion, Hypomobility, Increased edema, Decreased strength  Visit Diagnosis: Chronic pain of right knee  Muscle weakness (generalized)  Stiffness of right knee, not elsewhere classified  Chronic pain of left knee  Other abnormalities of gait and mobility     Problem List Patient Active Problem List   Diagnosis Date Noted  . Inflammatory arthritis 12/13/2015  . Intercostal pain 05/08/2015  . GERD (gastroesophageal reflux disease) 04/03/2015  . Internal hemorrhoid 09/03/2014  . Urge incontinence 12/13/2013  . Tension headache 09/25/2013  . Morbid obesity with BMI of 45.0-49.9, adult ([place]) 05/15/2013  . Vitamin D deficiency 08/05/2012  . Asthma, chronic 07/28/2012  . Arthritis 07/28/2012  . Health maintenance examination 07/28/2012    Brew,KAREN PTA 01/07/2016, 4:55 PM  [facility] [address] [place], [place], [address] Phone: [phone]   Fax:  [phone]  Name: Kathleen Tamm MRN: [id number] Date of Birth: 03-Jun-1970

## 2016-01-08 ENCOUNTER — Other Ambulatory Visit: Payer: Self-pay | Admitting: *Deleted

## 2016-01-08 NOTE — Telephone Encounter (Signed)
Opened in error, duplicate refill request..Goldston, Darlene Cassady10/25/201710:03 AM

## 2016-01-09 ENCOUNTER — Ambulatory Visit: Payer: Medicare Other

## 2016-01-09 DIAGNOSIS — M6281 Muscle weakness (generalized): Secondary | ICD-10-CM

## 2016-01-09 DIAGNOSIS — M25562 Pain in left knee: Secondary | ICD-10-CM

## 2016-01-09 DIAGNOSIS — M25661 Stiffness of right knee, not elsewhere classified: Secondary | ICD-10-CM | POA: Diagnosis not present

## 2016-01-09 DIAGNOSIS — M25561 Pain in right knee: Secondary | ICD-10-CM | POA: Diagnosis not present

## 2016-01-09 DIAGNOSIS — R2689 Other abnormalities of gait and mobility: Secondary | ICD-10-CM

## 2016-01-09 DIAGNOSIS — G8929 Other chronic pain: Secondary | ICD-10-CM

## 2016-01-09 NOTE — Therapy (Signed)
[place], [place], [address] Phone: [phone]   Fax:  [phone]  Physical Therapy Treatment  Patient Details  Name: Cassidy [name] MRN: [id number] Date of Birth: 05/23/1970 Referring Provider: Axel Filler MD  Encounter Date: 01/09/2016      PT End of Session - 01/09/16 1340    Visit Number 17   Number of Visits 22   Date for PT Re-Evaluation 01/24/16   Authorization Type medicare   PT Start Time 1334   PT Stop Time 1415   PT Time Calculation (min) 41 min   Activity Tolerance Patient tolerated treatment well   Behavior During Therapy [facility] for tasks assessed/performed      Past Medical History:  Diagnosis Date  . Arthritis   . Asthma   . Morbid obesity with BMI of 45.0-49.9, adult ([place]) 05/15/2013    Past Surgical History:  Procedure Laterality Date  . CHOLECYSTECTOMY  1998  . RECTAL PROLAPSE REPAIR, RECTOPEXY  2007   Uterine and rectal prolapse repair.  . TUBAL LIGATION Bilateral 2005    There were no vitals filed for this visit.      Subjective Assessment - 01/09/16 1336    Subjective Pain  5/10 left knee.  No mor pain than usual.   Currently in Pain? Yes   Pain Score 5    Pain Location Knee   Pain Orientation Right;Left   Pain Descriptors / Indicators Aching   Pain Type Chronic pain   Pain Onset More than a month ago   Pain Frequency Constant   Aggravating Factors  sit to stand , walking , exercises   Pain Relieving Factors rest, soaking /heat at times            [facility] PT Assessment - 01/09/16 0001      AROM   Right Knee Extension 0   Right Knee Flexion 130   Left Knee Extension 0   Left Knee Flexion 130     Strength   Right Hip Flexion 4+/5   Right Hip Extension 4/5   Right Hip ABduction 4-/5   Left Hip Flexion 4+/5   Left Hip Extension 4/5  need pelvic rotation to stabiolize   Left Hip External Rotation --  needs pelvic rotation to stabilize  proximally   Left Hip ABduction 3+/5   Right Knee Flexion 5/5   Right Knee Extension 5/5   Left Knee Flexion 5/5   Left Knee Extension 5/5                     [facility] Adult PT Treatment/Exercise - 01/09/16 0001      Knee/Hip Exercises: Standing   Other Standing Knee Exercises alternating toe taps on top of 10 inch box RT and LT 10 reps  in // 2 x 10 then 5 reps heel toe RT Lt with weight shift fully to each leg slowly and LT  /RT laterally  emphasis on slow pace and weight bearing fully on stance leg x 8      Knee/Hip Exercises: Supine   Straight Leg Raises Right;Left;2 sets;10 reps                PT Education - 01/09/16 1424    Education provided Yes   Education Details 2 more weeks , 4 visits, practice stepping with full weight shift Rt and LT leg foreward and diagonals at home  modify distance as needed   Person(s) Educated Patient;Other (comment)  Methods Explanation;Demonstration;Verbal cues   Comprehension Returned demonstration;Verbalized understanding          PT Short Term Goals - 11/29/15 0902      PT SHORT TERM GOAL #1   Title I with HEP   Baseline no cues needed   Status Achieved     PT SHORT TERM GOAL #2   Title Pt is I with usage of ICE and HEP but chooses to not ice   Status Achieved     PT SHORT TERM GOAL #3   Title pt will be able to sit / stand/ walk for >/= 10 min with </= 5/10 pain to promote functional progression (10/31/2015)   Status Achieved           PT Long Term Goals - 01/09/16 1350      PT LONG TERM GOAL #1   Title pt will be I with all advanced HEP given throughtout therapy (11/21/2015)   Status On-going     PT LONG TERM GOAL #2   Title pt will improve R knee arom to >/= 110 degrees of flexion and extension to 0 degrees with </= 3/10 painto assist with function and efficent gait pattern (11/21/2015); AROM 126 degrees with pain  at 3/10   Baseline Range improved  Pain at 5/10   Status Partially Met     PT LONG TERM  GOAL #3   Baseline Pain with functional mobility 5/10 today   Status On-going     PT LONG TERM GOAL #4   Title pt will be able to sit/ stand/ walk >/= 20 min and navigate up/ down >/= 10 steps with 1 HHA using railing assist with </= 3/10 pain to promote funtional endurance required for school and ADLs (11/21/2015)   Baseline Good day walk for 20-25 min , pain on steps 7-8/10   Status Partially Met     PT LONG TERM GOAL #6   Title Pt will be able to transfer in/out of tub with 75% less difficulty   Baseline She reports no improvement   Status On-going     PT LONG TERM GOAL #7   Title Pt will be able to perform 20 SLR without compensation and improved eccentric descent 100% of the time   Status Achieved               Plan - 01/09/16 1353    Clinical Impression Statement Cassidy [name] appears to be improved with strength but pain levels though better appear more static at this time. She needs herHEP cleared up as to best HEP to do so she will bring her program next visit for assessment    Rehab Potential Good   PT Frequency 2x / week   PT Duration 2 weeks   PT Treatment/Interventions ADLs/Self Care Home Management;Cryotherapy;Electrical Stimulation;Iontophoresis '4mg'$ /ml Dexamethasone;Passive range of motion;Vasopneumatic Device;Taping;Dry needling;Therapeutic activities;Therapeutic exercise;Moist Heat;Manual techniques;Splinting   PT Next Visit Plan Continue for 4 visits to expand HEP prior to discharge   PT Home Exercise Plan alternating toe taps on step. (01/07/16) No new exercises, but go back to initial HEP for easier exercises. controlled stepping   Consulted and Agree with Plan of Care Patient;Family member/caregiver   Family Member Consulted fiance      Patient will benefit from skilled therapeutic intervention in order to improve the following deficits and impairments:  Abnormal gait, Decreased activity tolerance, Decreased endurance, Decreased balance, Pain, Improper body  mechanics, Postural dysfunction, Increased fascial restricitons, Difficulty walking, Decreased range of motion, Hypomobility,  Increased edema, Decreased strength  Visit Diagnosis: Chronic pain of right knee  Muscle weakness (generalized)  Stiffness of right knee, not elsewhere classified  Chronic pain of left knee  Other abnormalities of gait and mobility     Problem List Patient Active Problem List   Diagnosis Date Noted  . Inflammatory arthritis 12/13/2015  . Intercostal pain 05/08/2015  . GERD (gastroesophageal reflux disease) 04/03/2015  . Internal hemorrhoid 09/03/2014  . Urge incontinence 12/13/2013  . Tension headache 09/25/2013  . Morbid obesity with BMI of 45.0-49.9, adult ([place]) 05/15/2013  . Vitamin D deficiency 08/05/2012  . Asthma, chronic 07/28/2012  . Arthritis 07/28/2012  . Health maintenance examination 07/28/2012    Darrel Hoover  PT 01/09/2016, 2:31 PM  [facility] [address] [place], [place], [address] Phone: [phone]   Fax:  [phone]  Name: Cassidy [name] MRN: [id number] Date of Birth: 20-Mar-1970

## 2016-01-12 ENCOUNTER — Other Ambulatory Visit: Payer: Self-pay | Admitting: Internal Medicine

## 2016-01-12 DIAGNOSIS — K219 Gastro-esophageal reflux disease without esophagitis: Secondary | ICD-10-CM

## 2016-01-14 ENCOUNTER — Ambulatory Visit: Payer: Medicare Other | Admitting: Physical Therapy

## 2016-01-14 DIAGNOSIS — M25561 Pain in right knee: Principal | ICD-10-CM

## 2016-01-14 DIAGNOSIS — M25562 Pain in left knee: Secondary | ICD-10-CM | POA: Diagnosis not present

## 2016-01-14 DIAGNOSIS — G8929 Other chronic pain: Secondary | ICD-10-CM

## 2016-01-14 DIAGNOSIS — M6281 Muscle weakness (generalized): Secondary | ICD-10-CM | POA: Diagnosis not present

## 2016-01-14 DIAGNOSIS — M25661 Stiffness of right knee, not elsewhere classified: Secondary | ICD-10-CM

## 2016-01-14 DIAGNOSIS — R2689 Other abnormalities of gait and mobility: Secondary | ICD-10-CM | POA: Diagnosis not present

## 2016-01-14 NOTE — Therapy (Signed)
[facility] [address] [place], [place], [address] Phone: [phone]   Fax:  [phone]  Physical Therapy Treatment  Patient Details  Name: Cassidy [name] MRN: [id number] Date of Birth: 1970/07/15 Referring Provider: Tyson Aliasuncan Thomas Vincent MD  Encounter Date: 01/14/2016      PT End of Session - 01/14/16 1434    Visit Number 18   Number of Visits 22   Date for PT Re-Evaluation 01/24/16   PT Start Time 1333   PT Stop Time 1420   PT Time Calculation (min) 47 min   Activity Tolerance Patient tolerated treatment well   Behavior During Therapy [facility] for tasks assessed/performed      Past Medical History:  Diagnosis Date  . Arthritis   . Asthma   . Morbid obesity with BMI of 45.0-49.9, adult ([facility]) 05/15/2013    Past Surgical History:  Procedure Laterality Date  . CHOLECYSTECTOMY  1998  . RECTAL PROLAPSE REPAIR, RECTOPEXY  2007   Uterine and rectal prolapse repair.  . TUBAL LIGATION Bilateral 2005    There were no vitals filed for this visit.      Subjective Assessment - 01/14/16 1338    Subjective kNEES AND THIGHS WERE SORE LAST NIGHT.  i DID NOT bring in my exercises because i threw awy the previous exercises because i did not think i needed them.     Currently in Pain? Yes   Pain Score 3   had hips , breast, back pain last night,    worse from colds weather   Pain Location Knee   Pain Orientation Right;Left   Pain Descriptors / Indicators Aching   Pain Type Chronic pain   Pain Frequency Constant   Aggravating Factors  cold achey weather   Pain Relieving Factors rest soaking, heat   Effect of Pain on Daily Activities limits walking                         [facility] Adult PT Treatment/Exercise - 01/14/16 0001      Lumbar Exercises: Standing   Heel Raises 5 reps   Heel Raises Limitations unable to do single, both     Knee/Hip Exercises: Stretches   Passive Hamstring Stretch 3 reps;30  seconds;Both     Knee/Hip Exercises: Aerobic   Nustep 6 minutes, l5     Knee/Hip Exercises: Standing   Lateral Step Up Both;Hand Hold: 1;Step Height: 4"   Forward Step Up Both;1 set;5 reps;Hand Hold: 1;Hand Hold: 0;Step Height: 4"   Step Down Both;1 set;5 reps;Hand Hold: 1;Step Height: 4"     Knee/Hip Exercises: Seated   Long Arc Quad 5 reps;Both  Right has shaprp pain with extension.      Knee/Hip Exercises: Supine   Quad Sets 1 set;5 reps  6-7/10 right pain.   Short Arc [employer]ad Sets 10 reps;5 reps;Both   Heel Slides 5 reps;Both   Straight Leg Raises 5 reps;Both   Straight Leg Raises Limitations cues   Other Supine Knee/Hip Exercises iosmetric hamstrings.     Knee/Hip Exercises: Sidelying   Clams 5 X both  cues                  PT Short Term Goals - 11/29/15 0902      PT SHORT TERM GOAL #1   Title I with HEP   Baseline no cues needed   Status Achieved     PT SHORT TERM GOAL #2   Title  Pt is I with usage of ICE and HEP but chooses to not ice   Status Achieved     PT SHORT TERM GOAL #3   Title pt will be able to sit / stand/ walk for >/= 10 min with </= 5/10 pain to promote functional progression (10/31/2015)   Status Achieved           PT Long Term Goals - 01/14/16 1439      PT LONG TERM GOAL #1   Title pt will be I with all advanced HEP given throughtout therapy (11/21/2015)   Baseline needs cues   Time 6   Period Weeks   Status On-going     PT LONG TERM GOAL #2   Title pt will improve R knee arom to >/= 110 degrees of flexion and extension to 0 degrees with </= 3/10 painto assist with function and efficent gait pattern (11/21/2015); AROM 126 degrees with pain  at 3/10   Time 6   Period Weeks   Status Unable to assess     PT LONG TERM GOAL #3   Title pt will improve bil LE strength to >/= 4/5 with </= 3/10 pain to assist with functional mobility and safety during walking/ standing (11/21/2015)   Time 6   Period Weeks   Status Unable to assess      PT LONG TERM GOAL #4   Title pt will be able to sit/ stand/ walk >/= 20 min and navigate up/ down >/= 10 steps with 1 HHA using railing assist with </= 3/10 pain to promote funtional endurance required for school and ADLs (11/21/2015)   Time 6   Period Weeks   Status Unable to assess     PT LONG TERM GOAL #5   Title Increase FOTO score to </= 46% limited to demonstrate improvement in function (11/21/2015)   Time 6   Period Weeks   Status Unable to assess     PT LONG TERM GOAL #6   Title Pt will be able to transfer in/out of tub with 75% less difficulty   Baseline She reports no improvement   Time 6   Period Weeks   Status On-going     PT LONG TERM GOAL #7   Title Pt will be able to perform 20 SLR without compensation and improved eccentric descent 100% of the time   Baseline 5 X with some pain.  needs cues   Time 6   Period Weeks   Status On-going               Plan - 01/14/16 1435    Clinical Impression Statement 5/10 pain post session.  Focus on HEP today. All exercises were from exercise drawer due to patient throwing hers away. Fiance also educated.   Progress toward HEP goal.  Knee ROM WNL, both (Noted during exercises)   PT Next Visit Plan Continue for 3 visits to expand HEP prior to discharge   PT Home Exercise Plan alternating toe taps on step. (01/07/16) No new exercises, but go back to initial HEP for easier exercises. controlled stepping.  Level 1 and 2, knee, ankle standing,  knee strength standing.    Consulted and Agree with Plan of Care Patient;Family member/caregiver   Family Member Consulted fiance      Patient will benefit from skilled therapeutic intervention in order to improve the following deficits and impairments:     Visit Diagnosis: Chronic pain of right knee  Muscle weakness (generalized)  Stiffness of right knee, not elsewhere classified  Chronic pain of left knee  Other abnormalities of gait and mobility     Problem List Patient  Active Problem List   Diagnosis Date Noted  . Inflammatory arthritis 12/13/2015  . Intercostal pain 05/08/2015  . GERD (gastroesophageal reflux disease) 04/03/2015  . Internal hemorrhoid 09/03/2014  . Urge incontinence 12/13/2013  . Tension headache 09/25/2013  . Morbid obesity with BMI of 45.0-49.9, adult ([facility]) 05/15/2013  . Vitamin D deficiency 08/05/2012  . Asthma, chronic 07/28/2012  . Arthritis 07/28/2012  . Health maintenance examination 07/28/2012    Altizer,Deepa Barthel PTA 01/14/2016, 2:41 PM  [facility] [address] [place], [place], [address] Phone: [phone]   Fax:  [phone]  Name: Cassidy [name] MRN: [id number] Date of Birth: 20-Sep-1970

## 2016-01-16 ENCOUNTER — Ambulatory Visit: Payer: Medicare Other | Admitting: Physical Therapy

## 2016-01-21 ENCOUNTER — Ambulatory Visit: Payer: Medicare Other | Attending: Internal Medicine | Admitting: Physical Therapy

## 2016-01-21 DIAGNOSIS — M25562 Pain in left knee: Secondary | ICD-10-CM | POA: Insufficient documentation

## 2016-01-21 DIAGNOSIS — M6281 Muscle weakness (generalized): Secondary | ICD-10-CM | POA: Diagnosis not present

## 2016-01-21 DIAGNOSIS — G8929 Other chronic pain: Secondary | ICD-10-CM | POA: Insufficient documentation

## 2016-01-21 DIAGNOSIS — R2689 Other abnormalities of gait and mobility: Secondary | ICD-10-CM | POA: Insufficient documentation

## 2016-01-21 DIAGNOSIS — M25561 Pain in right knee: Secondary | ICD-10-CM | POA: Diagnosis not present

## 2016-01-21 DIAGNOSIS — M25661 Stiffness of right knee, not elsewhere classified: Secondary | ICD-10-CM | POA: Insufficient documentation

## 2016-01-21 NOTE — Therapy (Signed)
[facility] [address] [place], [place], [address] Phone: [phone]   Fax:  [phone]  Physical Therapy Treatment  Patient Details  Name: Cassidy [name] MRN: [id number] Date of Birth: Sep 30, 1970 Referring Provider: Tyson Aliasuncan Thomas Vincent MD  Encounter Date: 01/21/2016      PT End of Session - 01/21/16 1619    Visit Number 19   Number of Visits 22   PT Start Time 1417   PT Stop Time 1500   PT Time Calculation (min) 43 min   Activity Tolerance Patient tolerated treatment well   Behavior During Therapy [facility] for tasks assessed/performed      Past Medical History:  Diagnosis Date  . Arthritis   . Asthma   . Morbid obesity with BMI of 45.0-49.9, adult (HCC) 05/15/2013    Past Surgical History:  Procedure Laterality Date  . CHOLECYSTECTOMY  1998  . RECTAL PROLAPSE REPAIR, RECTOPEXY  2007   Uterine and rectal prolapse repair.  . TUBAL LIGATION Bilateral 2005    There were no vitals filed for this visit.      Subjective Assessment - 01/21/16 1614    Patient is accompained by: Family member  Fiance   Currently in Pain? No/denies   Pain Location Knee   Pain Orientation Right;Left                         [facility] Adult PT Treatment/Exercise - 01/21/16 0001      Lumbar Exercises: Machines for Strengthening   Cybex Knee Extension instructions to avoid full range,  last 30 degrees or so,  knees / hips neutral  10 X.  Soreness noted distal quads so stopped.     Cybex Knee Flexion 25 LBS 10 x 2 sets, with instruction to pause   Leg Press 1 plate both legs 20 X   Other Lumbar Machine Exercise Lat pull down and row with machines teaching tecjnique, 25 pounds.      Knee/Hip Exercises: Stretches   Passive Hamstring Stretch 3 reps;30 seconds     Knee/Hip Exercises: Aerobic   Nustep 6 minutes L4, legs only   Other Aerobic Elliptical, ramp 1, 1.5 minutes with instructions on how to operate. She needs to avoid  the abduction feature.    she should be able to talk comfortably                PT Education - 01/21/16 1619    Education provided Yes   Education Details education on progression to gym equipment.  Home exercise questions answered.   Methods Explanation;Demonstration;Verbal cues;Tactile cues   Comprehension Verbalized understanding;Returned demonstration          PT Short Term Goals - 11/29/15 0902      PT SHORT TERM GOAL #1   Title I with HEP   Baseline no cues needed   Status Achieved     PT SHORT TERM GOAL #2   Title Pt is I with usage of ICE and HEP but chooses to not ice   Status Achieved     PT SHORT TERM GOAL #3   Title pt will be able to sit / stand/ walk for >/= 10 min with </= 5/10 pain to promote functional progression (10/31/2015)   Status Achieved           PT Long Term Goals - 01/21/16 1625      PT LONG TERM GOAL #1   Title pt will be I with  all advanced HEP given throughtout therapy (11/21/2015)   Baseline has questions   Time 6   Period Weeks   Status On-going     PT LONG TERM GOAL #2   Title pt will improve R knee arom to >/= 110 degrees of flexion and extension to 0 degrees with </= 3/10 painto assist with function and efficent gait pattern (11/21/2015); AROM 126 degrees with pain  at 3/10   Time 6   Period Weeks   Status Unable to assess     PT LONG TERM GOAL #3   Title pt will improve bil LE strength to >/= 4/5 with </= 3/10 pain to assist with functional mobility and safety during walking/ standing (11/21/2015)   Baseline walking not painful for knees today   Time 6   Period Weeks   Status On-going     PT LONG TERM GOAL #4   Title pt will be able to sit/ stand/ walk >/= 20 min and navigate up/ down >/= 10 steps with 1 HHA using railing assist with </= 3/10 pain to promote funtional endurance required for school and ADLs (11/21/2015)   Time 6   Period Weeks   Status Unable to assess     PT LONG TERM GOAL #5   Title Increase FOTO  score to </= 46% limited to demonstrate improvement in function (11/21/2015)   Time 6   Period Weeks   Status Unable to assess     PT LONG TERM GOAL #6   Title Pt will be able to transfer in/out of tub with 75% less difficulty   Time 6   Period Weeks   Status Unable to assess     PT LONG TERM GOAL #7   Title Pt will be able to perform 20 SLR without compensation and improved eccentric descent 100% of the time   Time 6   Period Weeks   Status Unable to assess               Plan - 01/21/16 1620    Clinical Impression Statement No pain increase noted at end of  session today.  Focue continued on home exercises.  Many questions answered like what do I do first.  What are the goals, Many questions about Gym equipment.     PT Next Visit Plan Check goals. 1 more visit,  FOTO?  answer questions.   PT Home Exercise Plan alternating toe taps on step. (01/07/16) No new exercises, but go back to initial HEP for easier exercises. controlled stepping.  Level 1 and 2, knee, ankle standing,  knee strength standing.    Consulted and Agree with Plan of Care Patient   Family Member Consulted fiance      Patient will benefit from skilled therapeutic intervention in order to improve the following deficits and impairments:     Visit Diagnosis: Chronic pain of right knee  Muscle weakness (generalized)  Stiffness of right knee, not elsewhere classified  Chronic pain of left knee  Other abnormalities of gait and mobility     Problem List Patient Active Problem List   Diagnosis Date Noted  . Inflammatory arthritis 12/13/2015  . Intercostal pain 05/08/2015  . GERD (gastroesophageal reflux disease) 04/03/2015  . Internal hemorrhoid 09/03/2014  . Urge incontinence 12/13/2013  . Tension headache 09/25/2013  . Morbid obesity with BMI of 45.0-49.9, adult (HCC) 05/15/2013  . Vitamin D deficiency 08/05/2012  . Asthma, chronic 07/28/2012  . Arthritis 07/28/2012  . Health maintenance  examination  07/28/2012    Whatley,Artis Beggs PTA 01/21/2016, 4:50 PM  [facility] [address] [place], [place], [address] Phone: [phone]   Fax:  [phone]  Name: Cassidy [name] MRN: [id number] Date of Birth: Jan 03, 1971

## 2016-01-23 ENCOUNTER — Ambulatory Visit: Payer: Medicare Other | Admitting: Physical Therapy

## 2016-01-23 DIAGNOSIS — M6281 Muscle weakness (generalized): Secondary | ICD-10-CM

## 2016-01-23 DIAGNOSIS — M25562 Pain in left knee: Secondary | ICD-10-CM | POA: Diagnosis not present

## 2016-01-23 DIAGNOSIS — M25661 Stiffness of right knee, not elsewhere classified: Secondary | ICD-10-CM

## 2016-01-23 DIAGNOSIS — R2689 Other abnormalities of gait and mobility: Secondary | ICD-10-CM | POA: Diagnosis not present

## 2016-01-23 DIAGNOSIS — G8929 Other chronic pain: Secondary | ICD-10-CM

## 2016-01-23 DIAGNOSIS — M25561 Pain in right knee: Principal | ICD-10-CM

## 2016-01-23 NOTE — Therapy (Signed)
[place], [place], [address] Phone: [phone]   Fax:  [phone]  Physical Therapy Treatment / Discharge Note  Patient Details  Name: Makinzey Banes MRN: [id number] Date of Birth: 07-Jan-1971 Referring Provider: Axel Filler MD  Encounter Date: 01/23/2016      PT End of Session - 01/23/16 1410    Visit Number 20   Number of Visits 22   Date for PT Re-Evaluation 01/24/16   Authorization Type medicare   PT Start Time 1330   PT Stop Time 1410   PT Time Calculation (min) 40 min   Activity Tolerance Patient tolerated treatment well   Behavior During Therapy [facility] for tasks assessed/performed      Past Medical History:  Diagnosis Date  . Arthritis   . Asthma   . Morbid obesity with BMI of 45.0-49.9, adult ([place]) 05/15/2013    Past Surgical History:  Procedure Laterality Date  . CHOLECYSTECTOMY  1998  . RECTAL PROLAPSE REPAIR, RECTOPEXY  2007   Uterine and rectal prolapse repair.  . TUBAL LIGATION Bilateral 2005    There were no vitals filed for this visit.      Subjective Assessment - 01/23/16 1333    Subjective "I am little sore today, doing exercises about every other day"    Currently in Pain? Yes   Pain Score 7    Pain Orientation Right;Left   Pain Descriptors / Indicators Aching   Pain Type Chronic pain   Pain Onset More than a month ago   Pain Frequency Constant   Aggravating Factors  cold weather, prolonged walking, stairs more up than down   Pain Relieving Factors resting, heat,             [facility] PT Assessment - 01/23/16 0001      Observation/Other Assessments   Focus on Therapeutic Outcomes (FOTO)  55% limited     AROM   Right Knee Extension 0   Right Knee Flexion 130   Left Knee Extension 0   Left Knee Flexion 130     Strength   Right Hip Flexion 4+/5   Right Hip Extension 4+/5   Right Hip ABduction 4/5   Left Hip Flexion 4+/5   Left Hip Extension 4+/5   Left Hip ABduction 4-/5   Right Knee Flexion 5/5   Right Knee Extension 5/5   Left Knee Flexion 5/5   Left Knee Extension 5/5                             PT Education - 01/23/16 1409    Education provided Yes   Education Details reviewed previously given HEP, discussed importance of staying consistent with HEP to maintain function, How to progress exercises by increasing reps/ sets to promote endurnace, beneifts of aquatic exericise, provided [employer] referral.    Person(s) Educated Patient   Methods Explanation;Verbal cues   Comprehension Verbalized understanding;Verbal cues required          PT Short Term Goals - 01/23/16 1347      PT SHORT TERM GOAL #1   Title I with HEP   Time 3   Period Weeks   Status Achieved     PT SHORT TERM GOAL #2   Title Pt is I with usage of ICE and HEP but chooses to not ice   Time 3   Period Weeks   Status Achieved  PT SHORT TERM GOAL #3   Title pt will be able to sit / stand/ walk for >/= 10 min with </= 5/10 pain to promote functional progression (10/31/2015)   Time 3   Period Weeks   Status Achieved           PT Long Term Goals - 01/23/16 1349      PT LONG TERM GOAL #1   Title pt will be I with all advanced HEP given throughtout therapy (11/21/2015)   Period Weeks   Status Achieved     PT LONG TERM GOAL #2   Title pt will improve R knee arom to >/= 110 degrees of flexion and extension to 0 degrees with </= 3/10 painto assist with function and efficent gait pattern (11/21/2015); AROM 126 degrees with pain  at 3/10   Time 6   Period Weeks   Status Achieved     PT LONG TERM GOAL #3   Title pt will improve bil LE strength to >/= 4/5 with </= 3/10 pain to assist with functional mobility and safety during walking/ standing (11/21/2015)   Time 6   Period Weeks   Status Partially Met     PT LONG TERM GOAL #4   Title pt will be able to sit/ stand/ walk >/= 20 min and navigate up/ down >/= 10 steps with 1 HHA  using railing assist with </= 3/10 pain to promote funtional endurance required for school and ADLs (11/21/2015)   Time 6   Period Weeks   Status Achieved     PT LONG TERM GOAL #5   Title Increase FOTO score to </= 46% limited to demonstrate improvement in function (11/21/2015)   Time 6   Period Weeks   Status Achieved     PT LONG TERM GOAL #6   Title Pt will be able to transfer in/out of tub with 75% less difficulty   Time 6   Period Weeks   Status Partially Met     PT LONG TERM GOAL #7   Title Pt will be able to perform 20 SLR without compensation and improved eccentric descent 100% of the time   Period Weeks   Status Achieved               Plan - 01/23/16 1411    Clinical Impression Statement Mrs. Seybold has been mostly consistent with her HEP. she has improved bil knee AROM and strength in the hips/ knees. She has met/ partially met all goals this visit. discussed at length benefits of conitnued exercise and provided [employer]. she is able ot maintain and progress her current level of function independently will be discharged.    PT Next Visit Plan discharged    PT Home Exercise Plan alternating toe taps on step. (01/07/16) No new exercises, but go back to initial HEP for easier exercises. controlled stepping.  Level 1 and 2, knee, ankle standing,  knee strength standing.       Patient will benefit from skilled therapeutic intervention in order to improve the following deficits and impairments:  Abnormal gait, Decreased activity tolerance, Decreased endurance, Decreased balance, Pain, Improper body mechanics, Postural dysfunction, Increased fascial restricitons, Difficulty walking, Decreased range of motion, Hypomobility, Increased edema, Decreased strength  Visit Diagnosis: Chronic pain of right knee  Muscle weakness (generalized)  Stiffness of right knee, not elsewhere classified  Chronic pain of left knee  Other abnormalities of gait and mobility        G-Codes -  01/23/16 1413    Functional Assessment Tool Used FOTO/ Clinical judgement   Functional Limitation Mobility: Walking and moving around   Mobility: Walking and Moving Around Goal Status [phone]) At least 20 percent but less than 40 percent impaired, limited or restricted   Mobility: Walking and Moving Around Discharge Status [phone]) At least 40 percent but less than 60 percent impaired, limited or restricted      Problem List Patient Active Problem List   Diagnosis Date Noted  . Inflammatory arthritis 12/13/2015  . Intercostal pain 05/08/2015  . GERD (gastroesophageal reflux disease) 04/03/2015  . Internal hemorrhoid 09/03/2014  . Urge incontinence 12/13/2013  . Tension headache 09/25/2013  . Morbid obesity with BMI of 45.0-49.9, adult ([place]) 05/15/2013  . Vitamin D deficiency 08/05/2012  . Asthma, chronic 07/28/2012  . Arthritis 07/28/2012  . Health maintenance examination 07/28/2012   Starr Lake PT, DPT, LAT, ATC  01/23/16  2:14 PM      [facility] [address] [place], [place], [address] Phone: [phone]   Fax:  [phone]  Name: Jakeya Gherardi MRN: [id number] Date of Birth: Jul 29, 1970   PHYSICAL THERAPY DISCHARGE SUMMARY  Visits from Start of Care: 20  Current functional level related to goals / functional outcomes: See goals   Remaining deficits: Intermittent bil knee and hip soreness, limited endurance with prolonged standing and walking activities.    Education / Equipment: HEP, posture, lilfting/ carrying mechanics, theraband for strengthening.   Plan: Patient agrees to discharge.  Patient goals were partially met. Patient is being discharged due to being pleased with the current functional level.  ?????

## 2016-02-14 ENCOUNTER — Other Ambulatory Visit: Payer: Self-pay | Admitting: Internal Medicine

## 2016-02-14 DIAGNOSIS — K219 Gastro-esophageal reflux disease without esophagitis: Secondary | ICD-10-CM

## 2016-02-28 ENCOUNTER — Ambulatory Visit (INDEPENDENT_AMBULATORY_CARE_PROVIDER_SITE_OTHER): Payer: Medicare Other | Admitting: Internal Medicine

## 2016-02-28 ENCOUNTER — Encounter: Payer: Self-pay | Admitting: Internal Medicine

## 2016-02-28 VITALS — BP 120/61 | HR 62 | Temp 98.3°F | Ht 66.0 in | Wt 317.6 lb

## 2016-02-28 DIAGNOSIS — M79641 Pain in right hand: Secondary | ICD-10-CM | POA: Diagnosis present

## 2016-02-28 DIAGNOSIS — M19042 Primary osteoarthritis, left hand: Principal | ICD-10-CM

## 2016-02-28 DIAGNOSIS — M79642 Pain in left hand: Secondary | ICD-10-CM

## 2016-02-28 DIAGNOSIS — Z0000 Encounter for general adult medical examination without abnormal findings: Secondary | ICD-10-CM

## 2016-02-28 DIAGNOSIS — M19041 Primary osteoarthritis, right hand: Secondary | ICD-10-CM

## 2016-02-28 MED ORDER — MELOXICAM 15 MG PO TABS: 15.0000 mg | ORAL_TABLET | Freq: Every day | ORAL | 3 refills | Status: DC

## 2016-02-28 NOTE — Patient Instructions (Signed)
It was a pleasure to see you today. I would like to get xrays of your hands to look if there is any joint damage or inflammation that might recommend a different treatment than what we have tried so far. Your previous blood tests looked normal so I do not suspect a rheumatoid arthritis or similar disease.  I will refill the prescription for meloxicam once daily to continue taking for your joint pains.  I will call or send a letter once your xray results are available. If no concerning findings are there we can see you again later next year.

## 2016-02-28 NOTE — Progress Notes (Signed)
   CC: Hand pain  HPI:  Cassidy [name] is a 45 y.o. woman with a history of obesity, asthma, and arthritis presenting for follow up of her hand pain that has been going on intermittently in the past few months. Her main complaint is at the right thumb and medial wrist. This pain was improved temporarily with the steroid dosepak she took in October but she does not feel it went away completely and has worsened again since that time. She has much milder pain in the left hand. She denies significant associated weakness or numbness. Her pain is worst with use and there is generally little to no swelling.  See problem based assessment and plan below for additional details.  Past Medical History:  Diagnosis Date  . Arthritis   . Asthma   . Morbid obesity with BMI of 45.0-49.9, adult (HCC) 05/15/2013    Review of Systems:   Review of Systems  Respiratory: Negative for shortness of breath.   Cardiovascular: Negative for leg swelling.  Musculoskeletal: Positive for back pain and joint pain. Negative for falls.  Skin: Negative for rash.  Neurological: Negative for sensory change and weakness.  Endo/Heme/Allergies: Negative for environmental allergies.    Physical Exam:  GENERAL- alert, co-operative, NAD HEENT- Atraumatic, PERRL, EOMI, oral mucosa appears moist CARDIAC- RRR, no murmurs, rubs or gallops. RESP- CTAB, no wheezes or crackles EXTREMITIES- Right hand mildly tender to palpation over the 1st CMC joint and 1st MCP, there is no swelling or erythema of the hand joints, wrist and hand ROM are normal, negative tinel's and phalen's signs SKIN- Warm, dry, No rash or lesion.  Vitals:   02/28/16 1322  BP: 120/61  Pulse: 62  Temp: 98.3 F (36.8 C)  TempSrc: Oral  SpO2: 100%  Weight: (!) 317 lb 9.6 oz (144.1 kg)  Height: 5\' 6"  (1.676 m)    Assessment & Plan:   See Encounters Tab for problem based charting.  Patient discussed with Dr. Cyndie ChimeGranfortuna

## 2016-03-02 NOTE — Assessment & Plan Note (Signed)
She declines flu vaccine today.

## 2016-03-02 NOTE — Progress Notes (Signed)
Medicine attending: Medical history, presenting problems, physical findings, and medications, reviewed with resident physician Dr Christopher Rice on the day of the patient visit and I concur with his evaluation and management plan. 

## 2016-03-02 NOTE — Assessment & Plan Note (Signed)
A: She continues to have hand pain that had improved with steroid dosepack in October. There is no evidence of wrist or hand swelling on examination today, no significant morning stiffness, and the pain is worsened with repeated use of her fingers. Her range of motion and grip strength are normal. These features all suggest against an inflammatory arthritis at this time. There is no significant numbness, weakness, night time symptoms, or exam findings suggesting carpal tunnel syndrome as an etiology now. Because her symptoms have been persistent for so many months though and her young age for significant degenerative disease of the hands I would like to check plain hand films for evidence of erosions or chondrocalcinosis.  P: -Refilled meloxicam 15mg  daily -Xray hands bilaterally -If pain is a major problem can try adding additional topical analgesics

## 2016-03-17 ENCOUNTER — Other Ambulatory Visit: Payer: Self-pay | Admitting: Internal Medicine

## 2016-03-17 DIAGNOSIS — K219 Gastro-esophageal reflux disease without esophagitis: Secondary | ICD-10-CM

## 2016-04-19 ENCOUNTER — Other Ambulatory Visit: Payer: Self-pay | Admitting: Internal Medicine

## 2016-04-19 DIAGNOSIS — M1990 Unspecified osteoarthritis, unspecified site: Secondary | ICD-10-CM

## 2016-05-16 ENCOUNTER — Other Ambulatory Visit: Payer: Self-pay | Admitting: Internal Medicine

## 2016-07-13 ENCOUNTER — Telehealth: Payer: Self-pay | Admitting: Physical Therapy

## 2016-07-13 ENCOUNTER — Telehealth: Payer: Self-pay | Admitting: Internal Medicine

## 2016-07-13 NOTE — Telephone Encounter (Signed)
Cassidy [name] has misplaced her Home exercises.  She has not done any since her discharge.  I agreed to mail her the exercises.  She wanted another card to a gym that included a trainer and 3 months membership for 100$.  I did not know anything about the card but promised to ask Anne Ng PT about it when he returns from vacation next week.

## 2016-07-13 NOTE — Telephone Encounter (Signed)
APT. REMINDER CALL, LMTCB °

## 2016-07-14 ENCOUNTER — Ambulatory Visit (HOSPITAL_COMMUNITY)
Admission: RE | Admit: 2016-07-14 | Discharge: 2016-07-14 | Disposition: A | Discharge status: Home / Self Care | Payer: Medicare Other | Source: Ambulatory Visit | Attending: Internal Medicine | Admitting: Internal Medicine

## 2016-07-14 ENCOUNTER — Encounter: Payer: Self-pay | Admitting: Internal Medicine

## 2016-07-14 ENCOUNTER — Ambulatory Visit (INDEPENDENT_AMBULATORY_CARE_PROVIDER_SITE_OTHER): Payer: Medicare Other | Admitting: Internal Medicine

## 2016-07-14 VITALS — BP 130/80 | HR 55 | Temp 98.5°F | Wt 316.6 lb

## 2016-07-14 DIAGNOSIS — M1990 Unspecified osteoarthritis, unspecified site: Secondary | ICD-10-CM

## 2016-07-14 DIAGNOSIS — R0782 Intercostal pain: Secondary | ICD-10-CM

## 2016-07-14 DIAGNOSIS — M170 Bilateral primary osteoarthritis of knee: Secondary | ICD-10-CM

## 2016-07-14 DIAGNOSIS — Z6841 Body Mass Index (BMI) 40.0 and over, adult: Secondary | ICD-10-CM

## 2016-07-14 NOTE — Assessment & Plan Note (Addendum)
Chest pain - feels due to stress, not having pain today. Had chest pain in the past, was seen on 04/2015 for this and EKG was normal at that time and was thoguht to be chostochondriatis. Was given NSAID that time. CP happens at rest and also with exertion, no true association with any activities, is on the right chest, with any chest wall movement she feels sharp pain without any radiation, no SOB, no dizziness, palpitations, sweating, or any other symptoms. Has some occasional headache with it. CP resolves spontaneously after few mins. No chest pain right now.   Most likely MSK/intercostal pain. -EKG today done showed NSR, no ST changes. No PR depression. - cont NSAID Meloxicam. Take tylenol on top PRN.

## 2016-07-14 NOTE — Patient Instructions (Signed)
Your chest pain is likely from muscular strain or chest wall pain.   Start exercising and try to lose some weight to help with your knee pain.  Keep taking meloxicam.  f/up in 3 months with your regular doctor.

## 2016-07-14 NOTE — Assessment & Plan Note (Signed)
Has hx of b/l knee pain thought to be from OA. Xray 2014 showed early degen changes on both knees. She is morbidly Obese. Did PT for knee pain. Her ROM improved on both knees with PT [facility]9. Having 5/10 b/l knee pain. Taking meloxicam. Has not done any exercise after finishing PT session. We discussed knee injection risk and benefits and she was to defer it for now. States she will focus on exercising.  -cont meloxicam, exercise, try losing weight.

## 2016-07-14 NOTE — Progress Notes (Signed)
   CC: knee pain  HPI:  [name] is a 46 y.o. with PMH as listed below is here for b/l knee pain. Also has intermittent CP.     Past Medical History:  Diagnosis Date  . Arthritis   . Asthma   . Morbid obesity with BMI of 45.0-49.9, adult (HCC) 05/15/2013   See problem based charting.   Review of Systems:   Review of Systems  Constitutional: Negative for chills and fever.  Cardiovascular: Positive for chest pain. Negative for palpitations and orthopnea.  Musculoskeletal: Positive for joint pain.  Neurological: Positive for headaches. Negative for dizziness.  Psychiatric/Behavioral: Negative for depression.     Physical Exam:  Vitals:   07/14/16 1001  BP: 130/80  Pulse: (!) 55  Temp: 98.5 F (36.9 C)  TempSrc: Oral  SpO2: 100%  Weight: (!) 316 lb 9.6 oz (143.6 kg)   Physical Exam  Constitutional: She is oriented to person, place, and time. She appears well-developed and well-nourished.  Pleasant, obese female.   HENT:  Head: Normocephalic and atraumatic.  Eyes: Conjunctivae are normal.  Respiratory: Effort normal and breath sounds normal. No respiratory distress. She has no wheezes.  No chest wall tenderness  GI: Soft. Bowel sounds are normal.  Musculoskeletal: Normal range of motion. She exhibits no edema or tenderness.  Neurological: She is alert and oriented to person, place, and time.  Psychiatric: She has a normal mood and affect.    Assessment & Plan:   See Encounters Tab for problem based charting.  Patient discussed with Dr. Cleda Daub

## 2016-07-15 NOTE — Progress Notes (Signed)
Internal Medicine Clinic Attending  Case discussed with Dr. Ahmed at the time of the visit.  We reviewed the resident's history and exam and pertinent patient test results.  I agree with the assessment, diagnosis, and plan of care documented in the resident's note. 

## 2016-09-18 ENCOUNTER — Encounter: Payer: Self-pay | Admitting: Internal Medicine

## 2016-09-18 ENCOUNTER — Encounter: Payer: Medicare Other | Admitting: Internal Medicine

## 2016-09-18 ENCOUNTER — Ambulatory Visit (INDEPENDENT_AMBULATORY_CARE_PROVIDER_SITE_OTHER): Payer: Medicare Other | Admitting: Internal Medicine

## 2016-09-18 VITALS — BP 136/82 | HR 69 | Temp 98.2°F | Ht 66.0 in | Wt 319.8 lb

## 2016-09-18 DIAGNOSIS — M19042 Primary osteoarthritis, left hand: Secondary | ICD-10-CM | POA: Diagnosis not present

## 2016-09-18 DIAGNOSIS — Z6841 Body Mass Index (BMI) 40.0 and over, adult: Secondary | ICD-10-CM | POA: Diagnosis not present

## 2016-09-18 DIAGNOSIS — M1990 Unspecified osteoarthritis, unspecified site: Secondary | ICD-10-CM

## 2016-09-18 DIAGNOSIS — E669 Obesity, unspecified: Secondary | ICD-10-CM

## 2016-09-18 DIAGNOSIS — M170 Bilateral primary osteoarthritis of knee: Secondary | ICD-10-CM

## 2016-09-18 DIAGNOSIS — Z79899 Other long term (current) drug therapy: Secondary | ICD-10-CM | POA: Diagnosis not present

## 2016-09-18 DIAGNOSIS — K219 Gastro-esophageal reflux disease without esophagitis: Secondary | ICD-10-CM

## 2016-09-18 DIAGNOSIS — M19041 Primary osteoarthritis, right hand: Secondary | ICD-10-CM

## 2016-09-18 MED ORDER — MELOXICAM 15 MG PO TABS: 15.0000 mg | ORAL_TABLET | Freq: Every day | ORAL | 2 refills | Status: DC

## 2016-09-18 MED ORDER — ESOMEPRAZOLE MAGNESIUM 40 MG PO CPDR: 40.0000 mg | DELAYED_RELEASE_CAPSULE | Freq: Every day | ORAL | 1 refills | Status: DC

## 2016-09-18 MED ORDER — DICLOFENAC SODIUM 1 % TD GEL: TRANSDERMAL | 0 refills | Status: DC

## 2016-09-18 NOTE — Progress Notes (Signed)
   CC: Follow up for arthritis  HPI:  [name] is a 46 y.o. woman here to discuss her arthritis medications and paperwork for vocational assistance.   See problem based assessment and plan below for additional details  Past Medical History:  Diagnosis Date  . Arthritis   . Asthma   . Morbid obesity with BMI of 45.0-49.9, adult (HCC) 05/15/2013    Review of Systems:  Review of Systems  Constitutional: Negative for malaise/fatigue.  Cardiovascular: Negative for chest pain and leg swelling.  Musculoskeletal: Positive for back pain and joint pain. Negative for falls.  Neurological: Negative for sensory change.    Physical Exam: Physical Exam  Constitutional:  Obese woman in no distress  Cardiovascular: Normal rate and regular rhythm.   Pulmonary/Chest: Effort normal and breath sounds normal.  Musculoskeletal:  Faint crepitus in R knee on ROM, no obvious bony deformity or effusion, 5/5 strength  Neurological: She has normal reflexes.    Vitals:   09/18/16 1541  BP: 136/82  Pulse: 69  Temp: 98.2 F (36.8 C)  TempSrc: Oral  SpO2: 100%  Weight: (!) 319 lb 12.8 oz (145.1 kg)  Height: 5\' 6"  (1.676 m)    Assessment & Plan:   See Encounters Tab for problem based charting.  Patient discussed with Dr. Heide SparkNarendra

## 2016-09-18 NOTE — Patient Instructions (Addendum)
It was a pleasure to see you today [name].  I hope you are successful in increasing activity and employment since this is great for both your health and your mood. I also sent refills for antiinflammatory medications such as meloxicam and voltaren you can use to help relieve some of your knee pain with activity.  I can see you again in a couple months unless you need to see us sooner for any reasons.

## 2016-09-21 NOTE — Assessment & Plan Note (Signed)
HPI: She has decreased symptoms of heartburn and throat irritation since taking esomeprazole as recommended. She does also take NSAIDs very frequently due to her arthritis pains.  A: Controlled GERD on PPI therapy  P: Reordered esomeprazole 40mg  daily

## 2016-09-21 NOTE — Assessment & Plan Note (Signed)
HPI: She continues to have arthritis pain in both knees and in hands R>L. She has not followed up significantly with recommended PT exercises since finishing the original course at the end of last year. She gets some benefit with NSAIDs both systemically and topically and is tolerating these pretty well.  A: Early osteoarthritis in knees and hands She is unfortunately suffering from degenerative joint disease at an early age due to her morbid obesity. She has not been very successful with weight loss or with maintaining PT on her own initiative to improve these symptoms. She is already not working on disability. We could potentially discuss the option of evaluation for bariatric surgery since her risk of morbidity in the future without some improvement is very high.  P: Future discussion of bariatric surgery Continue meloxicam, voltaren Work assistance paperwork filled at request today

## 2016-09-21 NOTE — Progress Notes (Signed)
Internal Medicine Clinic Attending  Case discussed with Dr. Rice at the time of the visit.  We reviewed the resident's history and exam and pertinent patient test results.  I agree with the assessment, diagnosis, and plan of care documented in the resident's note.  

## 2016-10-29 ENCOUNTER — Ambulatory Visit (INDEPENDENT_AMBULATORY_CARE_PROVIDER_SITE_OTHER): Payer: Medicare Other | Admitting: Internal Medicine

## 2016-10-29 VITALS — BP 138/67 | HR 68 | Temp 98.3°F | Wt 314.9 lb

## 2016-10-29 DIAGNOSIS — R0782 Intercostal pain: Secondary | ICD-10-CM | POA: Diagnosis present

## 2016-10-29 DIAGNOSIS — Z79899 Other long term (current) drug therapy: Secondary | ICD-10-CM | POA: Diagnosis not present

## 2016-10-29 DIAGNOSIS — Z791 Long term (current) use of non-steroidal anti-inflammatories (NSAID): Secondary | ICD-10-CM | POA: Diagnosis not present

## 2016-10-29 MED ORDER — DICLOFENAC SODIUM 1 % TD GEL: TRANSDERMAL | 1 refills | Status: DC

## 2016-10-29 NOTE — Patient Instructions (Signed)
[name] it was nice meeting you today.  -Take Mobic 15 mg once daily with food  -Use Voltaren gel 4 times a day as instructed  -You may also try a heating pad  -Please return to the clinic if your pain does not get better in the next 5-7 days. Seek immediate medical attention if you develop fevers, chills, incontinence, or any weakness or numbness in your body.

## 2016-10-30 NOTE — Progress Notes (Signed)
   CC: Right-sided chest and back pain  HPI:  [name] is a 46 y.o. female with a past medical history of condition listed below presenting to the clinic complaining of right-sided chest and back pain. Please see problem based charting for the status of the patient's current and chronic medical conditions.   Past Medical History:  Diagnosis Date  . Arthritis   . Asthma   . Morbid obesity with BMI of 45.0-49.9, adult (HCC) 05/15/2013   Review of Systems: Pertinent positives mentioned in HPI. Remainder of all ROS negative.   Physical Exam:  Vitals:   10/29/16 1349  BP: 138/67  Pulse: 68  Temp: 98.3 F (36.8 C)  TempSrc: Oral  SpO2: 99%  Weight: (!) 314 lb 14.4 oz (142.8 kg)   Physical Exam  Constitutional: She is oriented to person, place, and time. She appears well-developed and well-nourished.  Appears uncomfortable  HENT:  Head: Normocephalic and atraumatic.  Eyes: Right eye exhibits no discharge. Left eye exhibits no discharge.  Cardiovascular: Normal rate, regular rhythm and intact distal pulses.   Pulmonary/Chest: Effort normal and breath sounds normal. No respiratory distress. She has no wheezes. She has no rales.  Abdominal: Soft. Bowel sounds are normal. She exhibits no distension. There is no tenderness.  Musculoskeletal: She exhibits no edema or deformity.  Spine nontender to palpation. Neck has normal range of motion. Right shoulder with normal range of motion. Right sided thoracic paraspinal muscles tender to palpation. Lower border scapula tender to palpation. Right-sided point tenderness on palpation of ribs in the region of the anterior axillary line. Strength 5/5 and sensation to light touch intact in bilateral upper extremities.  Neurological: She is alert and oriented to person, place, and time.  Skin: Skin is warm and dry.    Assessment & Plan:   See Encounters Tab for problem based charting.  Patient discussed with Dr. Criselda Peaches

## 2016-10-30 NOTE — Assessment & Plan Note (Signed)
History of present illness Patient is presenting with right-sided chest and back pain that started a few hours ago in the morning. States she was okay when she woke up but then the pain started all of a sudden when she was laying in bed. She has a history of bilateral hand osteoarthritis and reports having occasional tingling in her right hand which is chronic. States her pain is worse with any kind of movement and better when she stays still. Pain is constant and dull in nature. It is 8 out of 10 in intensity. States it is not comfortable for her to take a deep breath due to pain. Denies having any shortness of breath otherwise. The pain is not radiating down to her right arm. Denies having any neck pain. No history of trauma or falls. Does mention that she was gardening yesterday and is right-handed. Denies having any fevers, diaphoresis, or cough. Reports having history of chronic lower back pain but now the pain she is experiencing is in her upper back on the right side. Denies any history of illicit drug use.  Assessment Her pain appears to be secondary to costochondritis/ musculoskeletal in nature. Patient was gardening a day ago. On exam, she had point tenderness on palpation of ribs on the right and also tenderness on palpation of right sided thoracic paraspinal muscles and lower border of the scapula. Spine was nontender to palpation. Neck and right shoulder had normal range of motion. Pain does not appear to be cardiac in nature as it is right-sided, nonexertional, and not associated with shortness of breath or diaphoresis. PE less likely as patient is not hypoxic or tachycardic.  Plan -Voltaren gel -Rest, heating pad -Meloxicam 15 mg daily with food -Return to the clinic if her symptoms do not improve in the next 5-7 days

## 2016-11-04 NOTE — Progress Notes (Signed)
Internal Medicine Clinic Attending  Case discussed with Dr. Rathoreat the time of the visit. We reviewed the resident's history and exam and pertinent patient test results. I agree with the assessment, diagnosis, and plan of care documented in the resident's note.  

## 2016-12-04 ENCOUNTER — Encounter: Payer: Medicare Other | Admitting: Internal Medicine

## 2016-12-11 ENCOUNTER — Encounter: Payer: Medicare Other | Admitting: Internal Medicine

## 2016-12-23 ENCOUNTER — Other Ambulatory Visit: Payer: Self-pay | Admitting: Internal Medicine

## 2017-01-01 ENCOUNTER — Encounter: Payer: Medicare Other | Admitting: Internal Medicine

## 2017-01-14 ENCOUNTER — Ambulatory Visit: Payer: Medicare Other

## 2017-01-29 ENCOUNTER — Encounter: Payer: Medicare Other | Admitting: Internal Medicine

## 2017-02-19 ENCOUNTER — Encounter: Payer: Medicare Other | Admitting: Internal Medicine

## 2017-02-26 ENCOUNTER — Ambulatory Visit (INDEPENDENT_AMBULATORY_CARE_PROVIDER_SITE_OTHER): Payer: Medicare Other | Admitting: Internal Medicine

## 2017-02-26 ENCOUNTER — Encounter: Payer: Self-pay | Admitting: Internal Medicine

## 2017-02-26 ENCOUNTER — Other Ambulatory Visit: Payer: Self-pay

## 2017-02-26 VITALS — BP 128/78 | HR 72 | Temp 98.0°F | Ht 66.0 in | Wt 308.5 lb

## 2017-02-26 DIAGNOSIS — N921 Excessive and frequent menstruation with irregular cycle: Secondary | ICD-10-CM | POA: Diagnosis not present

## 2017-02-26 DIAGNOSIS — R102 Pelvic and perineal pain: Secondary | ICD-10-CM | POA: Diagnosis not present

## 2017-02-26 DIAGNOSIS — N393 Stress incontinence (female) (male): Secondary | ICD-10-CM | POA: Diagnosis present

## 2017-02-26 DIAGNOSIS — R42 Dizziness and giddiness: Secondary | ICD-10-CM | POA: Diagnosis not present

## 2017-02-26 DIAGNOSIS — Z9851 Tubal ligation status: Secondary | ICD-10-CM

## 2017-02-26 DIAGNOSIS — N939 Abnormal uterine and vaginal bleeding, unspecified: Secondary | ICD-10-CM | POA: Diagnosis not present

## 2017-02-26 NOTE — Assessment & Plan Note (Addendum)
She is experiencing cramping pelvic pain extending to the lower abdomen and back since early this week about 4-5 days ago. She has a history or irregular interval menses that is constant to her recollection since her last pregnancy after which she underwent tubal ligation 14 years ago. Typically she has minor bleeding with some cramps. This episode is different due to large clots and she also reports unusual odor. The pain is also more severe than is usual for her and has not responded significantly to oral NSAIDs and tylenol so far. This is not associated with feeling hot or chills. She continues to have ongoing bleeding that is largely clots today. She notices some bright red blood in the toilet with bowel movements but otherwise normal appearing stool. She has one current sexual partner and does not use barrier contraception. Assessment A significant change in her usual menstrual bleeding ongoing for 5 days with pelvic pain. Pelvic and abdominal examination today just noted bilateral suprapubic tenderness without masses and blood present in vagina. This seems most likely to be a gynecological source of pain and bleeding, possibilities are broad and could even be as simple as variation in baseline irregular menses. Plan Referral to gynecology for a suspected gynecological etiology From a medicine perspective we will check for common innate coagulopathies, degree of blood loss, and hormone levels Factor VIII activity - elevated VWF - normal FSH, LH - normal CBC - Hgb is down to 10.5 from 11.7 with microcytosis, but not substantially lower than some past measurements

## 2017-02-26 NOTE — Patient Instructions (Addendum)
We will refer you to be seen at the gynecology office to better evaluate your pelvic pain and bleeding that is a new change for you. We are checking some blood work today to look for changes in hormone that can affect the uterus or for clotting problems. I will also check your total blood count and make sure you are not having low blood or platelets.  Besides this you are doing a great job with your health otherwise. We will plan to see you back again in a few months or sooner if you need us.

## 2017-02-26 NOTE — Progress Notes (Signed)
   CC: Abdominal cramping  HPI:  [name] is a 46 y.o. female with PMHx detailed below presenting with more than usual abdominal pain since Monday.  See problem based assessment and plan below for additional details.  Abnormal uterine bleeding She is experiencing cramping pelvic pain extending to the lower abdomen and back since early this week about 4-5 days ago. She has a history or irregular interval menses that is constant to her recollection since her last pregnancy after which she underwent tubal ligation 14 years ago. Typically she has minor bleeding with some cramps. This episode is different due to large clots and she also reports unusual odor. The pain is also more severe than is usual for her and has not responded significantly to oral NSAIDs and tylenol so far. This is not associated with feeling hot or chills. She continues to have ongoing bleeding that is largely clots today. She notices some bright red blood in the toilet with bowel movements but otherwise normal appearing stool. She has one current sexual partner and does not use barrier contraception. Assessment A significant change in her usual menstrual bleeding ongoing for 5 days with pelvic pain. Pelvic and abdominal examination today just noted bilateral suprapubic tenderness without masses and blood present in vagina. This seems most likely to be a gynecological source of pain and bleeding, possibilities are broad and could even be as simple as variation in baseline irregular menses. Plan Referral to gynecology for a suspected gynecological etiology From a medicine perspective we will check for common innate coagulopathies, degree of blood loss, and hormone levels Factor VIII activity - elevated VWF - normal FSH, LH - normal CBC - Hgb is down to 10.5 from 11.7 with microcytosis, but not substantially lower than some past measurements   Past Medical History:  Diagnosis Date  . Arthritis   . Asthma   . Morbid  obesity with BMI of 45.0-49.9, adult (HCC) 05/15/2013    Review of Systems: Review of Systems  Constitutional: Negative for chills and fever.  Respiratory: Negative for shortness of breath.   Cardiovascular: Negative for chest pain and leg swelling.  Gastrointestinal: Positive for abdominal pain. Negative for diarrhea.  Genitourinary: Negative for hematuria.  Musculoskeletal: Negative for myalgias.  Skin: Negative for rash.  Neurological: Positive for dizziness.  Endo/Heme/Allergies: Does not bruise/bleed easily.     Physical Exam: Vitals:   02/26/17 1323  BP: 128/78  Pulse: 72  Temp: 98 F (36.7 C)  TempSrc: Oral  SpO2: 100%  Weight: (!) 308 lb 8 oz (139.9 kg)  Height: 5\' 6"  (1.676 m)   GENERAL- alert, co-operative, NAD HEENT- Oral mucosa appears moist CARDIAC- RRR, no murmurs, rubs or gallops. RESP- CTAB, no wheezes or crackles. ABDOMEN- Suprapubic tenderness to palpation left and right of midline, "pressure" discomfort pressing over bladder  BACK- Midline low back pain, no CVA tenderness GENITOURINARY- Blood present in vagina, no obvious large clots, no cervical motion tenderness or visible lesion EXTREMITIES- Symmetric, no pedal edema.   Assessment & Plan:   See encounters tab for problem based medical decision making.   Patient discussed with Dr. Cyndie ChimeGranfortuna

## 2017-02-27 LAB — CBC
HEMOGLOBIN: 10.5 g/dL — AB (ref 11.1–15.9)
Hematocrit: 33.4 % — ABNORMAL LOW (ref 34.0–46.6)
MCH: 24.5 pg — ABNORMAL LOW (ref 26.6–33.0)
MCHC: 31.4 g/dL — AB (ref 31.5–35.7)
MCV: 78 fL — ABNORMAL LOW (ref 79–97)
PLATELETS: 423 10*3/uL — AB (ref 150–379)
RBC: 4.28 x10E6/uL (ref 3.77–5.28)
RDW: 16.5 % — AB (ref 12.3–15.4)
WBC: 6.7 10*3/uL (ref 3.4–10.8)

## 2017-02-27 LAB — LUTEINIZING HORMONE: LH: 3.8 m[IU]/mL

## 2017-02-27 LAB — VON WILLEBRAND PANEL
FACTOR VIII ACTIVITY: 203 % — AB (ref 57–163)
VON WILLEBRAND AG: 158 % (ref 50–200)
VON WILLEBRAND FACTOR: 143 % (ref 50–200)

## 2017-02-27 LAB — COAG STUDIES INTERP REPORT

## 2017-02-27 LAB — FOLLICLE STIMULATING HORMONE: FSH: 7.5 m[IU]/mL

## 2017-03-01 ENCOUNTER — Telehealth: Payer: Self-pay | Admitting: Internal Medicine

## 2017-03-01 NOTE — Telephone Encounter (Signed)
Pt requesting a call back about her test results. 

## 2017-03-01 NOTE — Progress Notes (Signed)
Medicine attending: Medical history, presenting problems, physical findings, and medications, reviewed with resident physician Dr Sheliah Hatchhristopher Rice on the day of the patient visit and I concur with his evaluation and management plan. Meno-metrorhagia and pelvic pain. She needs transvaginal ultrasound and GYN referral.

## 2017-03-02 ENCOUNTER — Encounter: Payer: Self-pay | Admitting: Obstetrics & Gynecology

## 2017-03-02 NOTE — Telephone Encounter (Signed)
I called her with results. She is curious about gynecology referral and I informed her it was already ordered so she should hear back about it sometime this week.

## 2017-04-12 ENCOUNTER — Encounter: Payer: Medicare Other | Admitting: Obstetrics & Gynecology

## 2017-04-18 ENCOUNTER — Other Ambulatory Visit: Payer: Self-pay | Admitting: Internal Medicine

## 2017-05-13 NOTE — Addendum Note (Signed)
Addended by: Dorie RankPOWERS, Saulo Anthis E on: 05/13/2017 07:00 AM   Modules accepted: Orders

## 2017-05-26 ENCOUNTER — Ambulatory Visit: Payer: Medicare Other

## 2017-05-29 ENCOUNTER — Other Ambulatory Visit: Payer: Self-pay | Admitting: Internal Medicine

## 2017-05-29 DIAGNOSIS — M1990 Unspecified osteoarthritis, unspecified site: Secondary | ICD-10-CM

## 2017-06-01 ENCOUNTER — Other Ambulatory Visit: Payer: Self-pay | Admitting: Internal Medicine

## 2017-06-01 ENCOUNTER — Other Ambulatory Visit: Payer: Self-pay | Admitting: *Deleted

## 2017-06-01 DIAGNOSIS — M19041 Primary osteoarthritis, right hand: Secondary | ICD-10-CM

## 2017-06-01 DIAGNOSIS — M19042 Primary osteoarthritis, left hand: Principal | ICD-10-CM

## 2017-06-01 MED ORDER — MELOXICAM 15 MG PO TABS: 15.0000 mg | ORAL_TABLET | Freq: Every day | ORAL | 1 refills | Status: DC

## 2017-06-01 NOTE — Telephone Encounter (Signed)
Pls sch appt before June with PCP

## 2017-06-10 ENCOUNTER — Ambulatory Visit: Payer: Medicare Other

## 2017-06-10 ENCOUNTER — Other Ambulatory Visit: Payer: Self-pay

## 2017-06-10 ENCOUNTER — Ambulatory Visit (INDEPENDENT_AMBULATORY_CARE_PROVIDER_SITE_OTHER): Payer: Medicare Other | Admitting: Internal Medicine

## 2017-06-10 DIAGNOSIS — L03114 Cellulitis of left upper limb: Secondary | ICD-10-CM

## 2017-06-10 MED ORDER — DOXYCYCLINE HYCLATE 100 MG PO CAPS: 100.0000 mg | ORAL_CAPSULE | Freq: Two times a day (BID) | ORAL | 0 refills | Status: AC

## 2017-06-10 NOTE — Patient Instructions (Addendum)
Thank you for seeing Cassidy [name] in the clinic today!  You were evaluated for Left Hand Swelling. We think you have an infection in your hand that is the cause of your swelling. Please take the antibiotic doxycycline for the next 5 days. If your symptoms do not improve after taking this antibiotic please return to clinic for evaluation. If your symptoms get worse while taking this medication  If you have any questions or concerns, please call our clinic at [phone] between the hours of 9am-5pm. If you have a problem after these hours, please call [phone] and ask for the internal medicine resident on call. If you feel you are having a medical emergency please call 911.   Thanks, Dr. Jeanella FlatteryMarybeth Evonne Rinks

## 2017-06-10 NOTE — Assessment & Plan Note (Signed)
Patient presents with signs and symptoms of L finger cellulitis. No signs of systemic infection as patient is afebrile with other vital signs within normal limits. Patient did not have fluid collection amenable to drainage on POC ultrasound, and also did not have significant fluid collections within the joint space concerning for infective tenosynovitis, which is reassuring that patient does not need IV antibiotics and urgent hand surgeon referral. Given the observation of cellulitis on ultrasound and PE, will treat patient with doxycycline 100 mg BID for 5 days and monitor clinical status with this intervention. Patient was instructed to follow up in clinic if symptoms do not improve on this regimen. Patient also instructed to call clinic or go to ED for evaluation if she develops worsening redness, pain, or fevers.  Plan: -Doxycycline 100 mg BID for 5 days -RTC if symptoms do not resolve or worsen on above regimen

## 2017-06-10 NOTE — Progress Notes (Signed)
   CC: L index finger and hand swelling  HPI:  [name] is a 47 y.o. with PMH of asthma who presents for evaluation of acute onset L index finger pain and swelling. Patient states she first noticed pain in L index finger while typing on the computer at work about 3 days ago. Since then she has noticed a constant, throbbing pain in her distal index finger. Over the past 3 days the pain has traveled from the tip of her finger to the palm of her hand and on the side of her wrist. Patient states pain intensifies and becomes sharp with movement of her fingers. She can no longer type at work. She has also noticed erythema of the tip of her finger and this morning some swelling on the dorsal aspect of her hand. No fevers, chills, nausea/vomiting, trauma to the area, animal bites, or lacerations.   Past Medical History: Past Medical History:  Diagnosis Date  . Arthritis   . Asthma   . Morbid obesity with BMI of 45.0-49.9, adult (HCC) 05/15/2013   Review of Systems:   Patient endorses L index finger pain and swelling, as per HPI Patient denies chest pain, shortness of breath, abdominal pain, diaphoresis, nausea/vomiting, lower extremity swelling, and change in bowel/bladder habits.  Physical Exam:  Vitals:   06/10/17 1414  BP: 137/79  Pulse: 79  Temp: 98.4 F (36.9 C)  TempSrc: Oral  SpO2: 100%  Weight: (!) 313 lb (142 kg)  Height: 5\' 4"  (1.626 m)   Physical Exam  Constitutional: She appears well-developed and well-nourished. No distress.  Cardiovascular: Normal rate, regular rhythm and intact distal pulses. Exam reveals no friction rub.  No murmur heard. GI: Soft. She exhibits no distension. There is no tenderness.  Musculoskeletal: She exhibits edema (Some edema of L index finger and possible area of fluctance over 2-3 MCP joint in L hand).  Decreased ROM of DIP and PIP joints 2/2 swelling and pain in L index finger. Tenderness with palpation over DIP and PIP joints on L index  finger. ROM at L MCP and wrist joints within normal limits. No tenderness with palpation of L MCP and wrist.   Skin: Skin is warm and dry. She is not diaphoretic.  Slight erythema and calor of L index finger compared to L middle finger and R index finger.   POC ultrasound: Edematous soft tissue throughout L hand consistent with cellulitis of the dorsal aspect of L hand over the 2nd MCP joint and throughout L index finger. No significant fluid collections within subcutaneous tissue to suggest abscess. No significant fluid collections within the joint spaces to suggest tenosynovitis.   Assessment & Plan:   See Encounters Tab for problem based charting.  Patient seen with Dr. Heide SparkNarendra.

## 2017-06-11 ENCOUNTER — Ambulatory Visit: Payer: Medicare Other

## 2017-06-11 NOTE — Progress Notes (Signed)
Internal Medicine Clinic Attending  Case discussed with Dr. Nedrud at the time of the visit.  We reviewed the resident's history and exam and pertinent patient test results.  I agree with the assessment, diagnosis, and plan of care documented in the resident's note.  

## 2017-07-02 ENCOUNTER — Other Ambulatory Visit: Payer: Self-pay | Admitting: Internal Medicine

## 2017-07-05 ENCOUNTER — Other Ambulatory Visit: Payer: Self-pay | Admitting: Internal Medicine

## 2017-07-07 ENCOUNTER — Other Ambulatory Visit: Payer: Self-pay | Admitting: Internal Medicine

## 2017-07-13 ENCOUNTER — Other Ambulatory Visit: Payer: Self-pay | Admitting: Internal Medicine

## 2017-08-05 ENCOUNTER — Other Ambulatory Visit: Payer: Self-pay | Admitting: Internal Medicine

## 2017-08-05 DIAGNOSIS — M1990 Unspecified osteoarthritis, unspecified site: Secondary | ICD-10-CM

## 2017-08-06 ENCOUNTER — Encounter: Payer: Medicare Other | Admitting: Internal Medicine

## 2017-08-13 ENCOUNTER — Encounter: Payer: Medicare Other | Admitting: Internal Medicine

## 2017-08-17 ENCOUNTER — Ambulatory Visit (INDEPENDENT_AMBULATORY_CARE_PROVIDER_SITE_OTHER): Payer: Medicare Other | Admitting: Internal Medicine

## 2017-08-17 ENCOUNTER — Other Ambulatory Visit: Payer: Self-pay

## 2017-08-17 DIAGNOSIS — A084 Viral intestinal infection, unspecified: Secondary | ICD-10-CM | POA: Diagnosis not present

## 2017-08-17 NOTE — Progress Notes (Signed)
   CC: N/V/D  HPI:  [name] is a 47 y.o. female with a past medical history listed below here today with complaints of nausea, vomiting, diarrhea.  For details of today's visit and the status of her chronic medical issues please refer to the assessment and plan.   Past Medical History:  Diagnosis Date  . Arthritis   . Asthma   . Morbid obesity with BMI of 45.0-49.9, adult (HCC) 05/15/2013   Review of Systems:   No chest pain or shortness of breath  Physical Exam:  Vitals:   08/17/17 0928  BP: (!) 131/97  Pulse: (!) 52  Temp: 98.2 F (36.8 C)  TempSrc: Oral  SpO2: 100%  Weight: (!) 317 lb 8 oz (144 kg)  Height: 5\' 7"  (1.702 m)   GENERAL- alert, co-operative, appears as stated age, not in any distress. HEENT- Atraumatic, normocephalic,oral mucosa appears moist CARDIAC- RRR, no murmurs, rubs or gallops. RESP- Moving equal volumes of air, and clear to auscultation bilaterally, no wheezes or crackles. ABDOMEN- Soft, minimal point tenderness in right lower quadrant , bowel sounds present. NEURO- No obvious Cr N abnormality. EXTREMITIES- pulse 2+, symmetric, no pedal edema. SKIN- Warm, dry, No rash or lesion. PSYCH- Normal mood and affect, appropriate thought content and speech.   Assessment & Plan:   See Encounters Tab for problem based charting.  Patient discussed with Dr. Sandre [name]

## 2017-08-17 NOTE — Patient Instructions (Signed)
Cassidy [name],  I am sorry that you are not feeling well.  It sounds like you picked up a stomach bug is causing your nausea and your diarrhea.  The most important thing to do is to make sure you are drinking plenty of water as well as getting plenty of electrolytes he can try Gatorade or soups.  If the diarrhea is to frequent and causing you problems during the day he can take over-the-counter loperamide this be careful not to get yourself constipated with this medication.  If you are waking up at night frequently you can take a dose before bedtime so he can sleep through the night without issues.

## 2017-08-17 NOTE — Assessment & Plan Note (Addendum)
[name] reports that for the past 5 days she has had nausea, vomiting, diarrhea.  She reports that last Thursday she develop nausea with one episode of nonbilious nonbloody diarrhea.  She has not had any episodes of emesis since that time but has had persistent nausea.  She subsequently has had 4-5 watery loose stools daily.  She has not noted any blood or mucus in her stools.  She reports that her diarrhea is not associated with meals.  She also notes that she has had 2 occurrences that have awoken her at night with urgency to have a bowel movement but she has not had any incontinence.  She reports that she has had intermittent subjective fevers as well as chills at home but has not checked her temperature.  She denies any abdominal pain.  She reports her appetite is unchanged and she is able to eat and drink without issue.  She does note that her daughter was recently sick with similar symptoms.  Most recent antibiotic use was doxycycline approximately 2-1/2 months ago.  No recent travel.  Assessment and plan: Patient is able to tolerate adequate p.o. intake does not appear dry on exam.  Discussed maintaining hydration status as well as her electrolytes to make up for her losses.  Discussed loperamide if needed especially at night.  Return precautions provided.

## 2017-08-18 ENCOUNTER — Other Ambulatory Visit: Payer: Self-pay | Admitting: Internal Medicine

## 2017-08-18 DIAGNOSIS — M1990 Unspecified osteoarthritis, unspecified site: Secondary | ICD-10-CM

## 2017-08-19 ENCOUNTER — Telehealth: Payer: Self-pay | Admitting: Internal Medicine

## 2017-08-19 NOTE — Progress Notes (Signed)
Internal Medicine Clinic Attending  Case discussed with Dr. Boswell  at the time of the visit.  We reviewed the resident's history and exam and pertinent patient test results.  I agree with the assessment, diagnosis, and plan of care documented in the resident's note.  Alexander N Raines, MD   

## 2017-08-19 NOTE — Telephone Encounter (Signed)
Patient is wanting a callback regarding shots in knee and physical therapy

## 2017-08-20 NOTE — Telephone Encounter (Signed)
Returned call to patient. She states she has spoken with PCP regarding need for PT and knee injections and was told to contact him when she was ready to proceed. She is now ready to proceed. Kinnie FeilL. Aaleeyah Bias, RN, BSN

## 2017-08-24 NOTE — Telephone Encounter (Signed)
I think [facility] visit for steroid injection and PT or Sports Medicine referral would be fine. Just make sure there is an appropriate attending on the schedule to precept the procedure- Lucila MaineMullen, Hoffman, Oswaldo DoneVincent, or Sandre KittyRaines this month all seem fine choices.

## 2017-08-27 ENCOUNTER — Encounter: Payer: Self-pay | Admitting: Internal Medicine

## 2017-08-27 ENCOUNTER — Ambulatory Visit: Payer: Medicare Other

## 2017-08-27 NOTE — Telephone Encounter (Signed)
[facility]. Kinnie FeilL. Evalisse Prajapati, RN, BSN

## 2017-09-06 ENCOUNTER — Other Ambulatory Visit: Payer: Self-pay

## 2017-09-06 ENCOUNTER — Encounter: Payer: Self-pay | Admitting: Internal Medicine

## 2017-09-06 ENCOUNTER — Ambulatory Visit (INDEPENDENT_AMBULATORY_CARE_PROVIDER_SITE_OTHER): Payer: Medicare Other | Admitting: Internal Medicine

## 2017-09-06 VITALS — BP 149/94 | HR 79 | Temp 98.0°F | Wt 315.2 lb

## 2017-09-06 DIAGNOSIS — N920 Excessive and frequent menstruation with regular cycle: Secondary | ICD-10-CM

## 2017-09-06 DIAGNOSIS — R5381 Other malaise: Secondary | ICD-10-CM | POA: Insufficient documentation

## 2017-09-06 DIAGNOSIS — D649 Anemia, unspecified: Secondary | ICD-10-CM

## 2017-09-06 DIAGNOSIS — R5383 Other fatigue: Secondary | ICD-10-CM

## 2017-09-06 NOTE — Assessment & Plan Note (Signed)
See HPI for full details.  Plan: Recheck CBC and ferritin today.

## 2017-09-06 NOTE — Assessment & Plan Note (Signed)
See HPI for full details.  Patient requesting referral to physical therapy. Will place referral today.

## 2017-09-06 NOTE — Assessment & Plan Note (Addendum)
See HPI for full details  Plan: We will check thyroid function today to assess for possible hypothyroidism.  Will also check a CBC and ferritin to assess for anemia.  If these are unremarkable would consider sleep study contributing to her fatigue.   ADDENDUM: Ferritin 14.  Sending Rx for Iron daily. Called and discussed with patient.

## 2017-09-06 NOTE — Patient Instructions (Signed)
[name],  I am going to check your thyoid and blood counts. I will let you know the results in the next couple of days when it comes back. If that doesn't show anything we can look into getting a sleep study done.   Please go ahead and schedule follow up with your PCP.

## 2017-09-06 NOTE — Progress Notes (Signed)
   CC: Fatigue and dyspnea  HPI:  [name] is a 47 y.o. female with a past medical history listed below here today  with complaints of fatigue, malaise, dyspnea.  She reports that the past several months she has developed worsening shortness of breath on exertion.  She reports that today when she was walking to clinic she was running late and rushing and became very short of breath.  She is unable to tell me if this is been a chronic problem with her normal exertion.  She does note increased difficulty going up stairs with dyspnea.  She denies any chest pain, nausea, vomiting, abdominal pain, vision changes.  She does note occasional lower extremity edema when she is on her feet that is responsive to elevation.  She reports that she has no energy and her weight remains stable despite a poor not eating very much.  She denies any history of sleep apnea reports she is never been told that she snores or stops breathing while sleeping.  She has no bed partners.  She does note that she wakes up several times at night however does not know why she wakes up.  She reports that she does not feel well rested in the morning and is often sleepy throughout the day.  She does note having heavy periods and reports being on iron supplementation in the past.   Past Medical History:  Diagnosis Date  . Arthritis   . Asthma   . Morbid obesity with BMI of 45.0-49.9, adult (HCC) 05/15/2013   Review of Systems:   Negative except as noted in HPI  Physical Exam:  Vitals:   09/06/17 1052  BP: (!) 149/94  Pulse: 79  Temp: 98 F (36.7 C)  TempSrc: Oral  SpO2: 100%  Weight: (!) 315 lb 3.2 oz (143 kg)   GENERAL- alert, co-operative, appears as stated age, not in any distress. CARDIAC- RRR, no murmurs, rubs or gallops. RESP- Moving equal volumes of air, and clear to auscultation bilaterally, no wheezes or crackles. ABDOMEN- Soft, nontender, bowel sounds present. EXTREMITIES- pulse 2+, symmetric, no pedal  edema. SKIN- Warm, dry, No rash or lesion. PSYCH- Normal mood and affect, appropriate thought content and speech.   Assessment & Plan:   See Encounters Tab for problem based charting.  Patient discussed with Dr. Criselda PeachesMullen

## 2017-09-07 LAB — BMP8+ANION GAP
Anion Gap: 11 mmol/L (ref 10.0–18.0)
BUN / CREAT RATIO: 11 (ref 9–23)
BUN: 9 mg/dL (ref 6–24)
CHLORIDE: 109 mmol/L — AB (ref 96–106)
CO2: 23 mmol/L (ref 20–29)
Calcium: 9.2 mg/dL (ref 8.7–10.2)
Creatinine, Ser: 0.84 mg/dL (ref 0.57–1.00)
GFR, EST AFRICAN AMERICAN: 98 mL/min/{1.73_m2} (ref >59)
GFR, EST NON AFRICAN AMERICAN: 85 mL/min/{1.73_m2} (ref >59)
GLUCOSE: 84 mg/dL (ref 65–99)
POTASSIUM: 4.5 mmol/L (ref 3.5–5.2)
Sodium: 143 mmol/L (ref 134–144)

## 2017-09-07 LAB — CBC
HEMATOCRIT: 34.5 % (ref 34.0–46.6)
Hemoglobin: 10.6 g/dL — ABNORMAL LOW (ref 11.1–15.9)
MCH: 25.2 pg — AB (ref 26.6–33.0)
MCHC: 30.7 g/dL — ABNORMAL LOW (ref 31.5–35.7)
MCV: 82 fL (ref 79–97)
Platelets: 343 10*3/uL (ref 150–450)
RBC: 4.2 x10E6/uL (ref 3.77–5.28)
RDW: 16.6 % — AB (ref 12.3–15.4)
WBC: 8.1 10*3/uL (ref 3.4–10.8)

## 2017-09-07 LAB — FERRITIN: Ferritin: 14 ng/mL — ABNORMAL LOW (ref 15–150)

## 2017-09-07 LAB — TSH: TSH: 2.37 u[IU]/mL (ref 0.450–4.500)

## 2017-09-07 MED ORDER — FERROUS FUMARATE 325 (106 FE) MG PO TABS: 1.0000 | ORAL_TABLET | Freq: Every day | ORAL | 2 refills | Status: AC

## 2017-09-07 NOTE — Addendum Note (Signed)
Addended by: Hollie SalkBOSWELL, Sheriden Archibeque L on: 09/07/2017 12:33 PM   Modules accepted: Orders

## 2017-09-09 NOTE — Progress Notes (Signed)
Internal Medicine Clinic Attending  Case discussed with Dr. Boswell at the time of the visit.  We reviewed the resident's history and exam and pertinent patient test results.  I agree with the assessment, diagnosis, and plan of care documented in the resident's note.  

## 2017-09-14 DIAGNOSIS — R2689 Other abnormalities of gait and mobility: Secondary | ICD-10-CM | POA: Diagnosis not present

## 2017-09-14 DIAGNOSIS — M25512 Pain in left shoulder: Secondary | ICD-10-CM | POA: Diagnosis not present

## 2017-09-14 DIAGNOSIS — M25562 Pain in left knee: Secondary | ICD-10-CM | POA: Diagnosis not present

## 2017-09-14 DIAGNOSIS — M25561 Pain in right knee: Secondary | ICD-10-CM | POA: Diagnosis not present

## 2017-09-14 DIAGNOSIS — M25511 Pain in right shoulder: Secondary | ICD-10-CM | POA: Diagnosis not present

## 2017-09-14 DIAGNOSIS — M545 Low back pain: Secondary | ICD-10-CM | POA: Diagnosis not present

## 2017-09-17 DIAGNOSIS — M25512 Pain in left shoulder: Secondary | ICD-10-CM | POA: Diagnosis not present

## 2017-09-17 DIAGNOSIS — M25562 Pain in left knee: Secondary | ICD-10-CM | POA: Diagnosis not present

## 2017-09-17 DIAGNOSIS — M25561 Pain in right knee: Secondary | ICD-10-CM | POA: Diagnosis not present

## 2017-09-17 DIAGNOSIS — M545 Low back pain: Secondary | ICD-10-CM | POA: Diagnosis not present

## 2017-09-17 DIAGNOSIS — R2689 Other abnormalities of gait and mobility: Secondary | ICD-10-CM | POA: Diagnosis not present

## 2017-09-17 DIAGNOSIS — M25511 Pain in right shoulder: Secondary | ICD-10-CM | POA: Diagnosis not present

## 2017-09-22 DIAGNOSIS — M25512 Pain in left shoulder: Secondary | ICD-10-CM | POA: Diagnosis not present

## 2017-09-22 DIAGNOSIS — M25561 Pain in right knee: Secondary | ICD-10-CM | POA: Diagnosis not present

## 2017-09-22 DIAGNOSIS — M25562 Pain in left knee: Secondary | ICD-10-CM | POA: Diagnosis not present

## 2017-09-22 DIAGNOSIS — M25511 Pain in right shoulder: Secondary | ICD-10-CM | POA: Diagnosis not present

## 2017-09-22 DIAGNOSIS — R2689 Other abnormalities of gait and mobility: Secondary | ICD-10-CM | POA: Diagnosis not present

## 2017-09-22 DIAGNOSIS — M545 Low back pain: Secondary | ICD-10-CM | POA: Diagnosis not present

## 2017-09-24 DIAGNOSIS — M25561 Pain in right knee: Secondary | ICD-10-CM | POA: Diagnosis not present

## 2017-09-24 DIAGNOSIS — R2689 Other abnormalities of gait and mobility: Secondary | ICD-10-CM | POA: Diagnosis not present

## 2017-09-24 DIAGNOSIS — M545 Low back pain: Secondary | ICD-10-CM | POA: Diagnosis not present

## 2017-09-24 DIAGNOSIS — M25562 Pain in left knee: Secondary | ICD-10-CM | POA: Diagnosis not present

## 2017-09-24 DIAGNOSIS — M25511 Pain in right shoulder: Secondary | ICD-10-CM | POA: Diagnosis not present

## 2017-09-24 DIAGNOSIS — M25512 Pain in left shoulder: Secondary | ICD-10-CM | POA: Diagnosis not present

## 2017-09-28 DIAGNOSIS — R2689 Other abnormalities of gait and mobility: Secondary | ICD-10-CM | POA: Diagnosis not present

## 2017-09-28 DIAGNOSIS — M25561 Pain in right knee: Secondary | ICD-10-CM | POA: Diagnosis not present

## 2017-09-28 DIAGNOSIS — M25511 Pain in right shoulder: Secondary | ICD-10-CM | POA: Diagnosis not present

## 2017-09-28 DIAGNOSIS — M25562 Pain in left knee: Secondary | ICD-10-CM | POA: Diagnosis not present

## 2017-09-28 DIAGNOSIS — M25512 Pain in left shoulder: Secondary | ICD-10-CM | POA: Diagnosis not present

## 2017-09-28 DIAGNOSIS — M545 Low back pain: Secondary | ICD-10-CM | POA: Diagnosis not present

## 2017-10-01 ENCOUNTER — Encounter: Payer: Self-pay | Admitting: Internal Medicine

## 2017-10-01 ENCOUNTER — Other Ambulatory Visit: Payer: Self-pay | Admitting: Internal Medicine

## 2017-10-01 ENCOUNTER — Other Ambulatory Visit: Payer: Self-pay | Admitting: Student in an Organized Health Care Education/Training Program

## 2017-10-01 ENCOUNTER — Other Ambulatory Visit: Payer: Self-pay

## 2017-10-01 ENCOUNTER — Ambulatory Visit (INDEPENDENT_AMBULATORY_CARE_PROVIDER_SITE_OTHER): Payer: Medicare Other | Admitting: Internal Medicine

## 2017-10-01 VITALS — BP 136/77 | HR 69 | Temp 98.8°F | Ht 65.0 in | Wt 313.9 lb

## 2017-10-01 DIAGNOSIS — R5381 Other malaise: Secondary | ICD-10-CM

## 2017-10-01 DIAGNOSIS — M1990 Unspecified osteoarthritis, unspecified site: Secondary | ICD-10-CM | POA: Diagnosis not present

## 2017-10-01 DIAGNOSIS — R5382 Chronic fatigue, unspecified: Secondary | ICD-10-CM

## 2017-10-01 DIAGNOSIS — N941 Unspecified dyspareunia: Secondary | ICD-10-CM | POA: Diagnosis not present

## 2017-10-01 DIAGNOSIS — J45909 Unspecified asthma, uncomplicated: Secondary | ICD-10-CM

## 2017-10-01 DIAGNOSIS — Z6841 Body Mass Index (BMI) 40.0 and over, adult: Secondary | ICD-10-CM

## 2017-10-01 DIAGNOSIS — E669 Obesity, unspecified: Secondary | ICD-10-CM | POA: Diagnosis not present

## 2017-10-01 DIAGNOSIS — Z569 Unspecified problems related to employment: Secondary | ICD-10-CM | POA: Diagnosis not present

## 2017-10-01 DIAGNOSIS — Z79899 Other long term (current) drug therapy: Secondary | ICD-10-CM | POA: Diagnosis not present

## 2017-10-01 DIAGNOSIS — M19041 Primary osteoarthritis, right hand: Secondary | ICD-10-CM

## 2017-10-01 DIAGNOSIS — M19042 Primary osteoarthritis, left hand: Principal | ICD-10-CM

## 2017-10-01 DIAGNOSIS — N939 Abnormal uterine and vaginal bleeding, unspecified: Secondary | ICD-10-CM

## 2017-10-01 DIAGNOSIS — D649 Anemia, unspecified: Secondary | ICD-10-CM | POA: Diagnosis not present

## 2017-10-01 DIAGNOSIS — Z559 Problems related to education and literacy, unspecified: Secondary | ICD-10-CM | POA: Diagnosis not present

## 2017-10-01 DIAGNOSIS — R5383 Other fatigue: Secondary | ICD-10-CM

## 2017-10-01 DIAGNOSIS — R6889 Other general symptoms and signs: Secondary | ICD-10-CM | POA: Insufficient documentation

## 2017-10-01 DIAGNOSIS — D509 Iron deficiency anemia, unspecified: Secondary | ICD-10-CM | POA: Diagnosis not present

## 2017-10-01 LAB — POCT URINE PREGNANCY: PREG TEST UR: NEGATIVE
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
## 2017-10-01 NOTE — Assessment & Plan Note (Signed)
Microcytic anemia not responding to iron supplementation.  Check B12 and folate today.

## 2017-10-01 NOTE — Assessment & Plan Note (Addendum)
Chronic history of heavy, irregular, painful menses.  Also endorses dyspareunia.  Denies hirsutism.  Nondiabetic.  Has been on birth control pills many many years ago.  Suspicion for PCOS versus fibroids.  Will check testosterone and order transvaginal ultrasound  ADDENDUM: - Testosterone WNLs. Less likely PCOS.  - F/u transvaginal ultrasound

## 2017-10-01 NOTE — Assessment & Plan Note (Addendum)
Microcytic anemia with ferritin of 14.  Has been on iron replacement for 3 weeks with no symptomatic improvement.  History of heavy, irregular periods.  No melena or hematochezia.  Continue oral iron replacement.  Will check CBC with differential, reticulocyte count, B12, folate  ADDENDUM: - B12 and folate WNLs - Hb unchanged since starting oral iron supplementation and retic count is inappropriately low. Discussed with patient and discovered she has been taking iron pills with cereal/milk. Advised patient to avoid this as milk can decrease iron absorption. Rec'd patient take oral iron on an empty stomach with orange juice. Patient verbalized understanding.  - recheck ferritin at 1 month f/u

## 2017-10-01 NOTE — Assessment & Plan Note (Signed)
Acute on chronic. 2 months of worsening fatigue and abnormal sleep patterns.  Anemia could be a contributing factor.  Also high concern for obstructive sleep apnea given patient's weight.  Order placed today for sleep study

## 2017-10-01 NOTE — Patient Instructions (Addendum)
It was nice seeing you today. Thank you for choosing [facility] for your Primary Care.   I will follow up with you on any abnormal lab results. [facility]. I have also ordered a transvaginal ultrasound to evaluate your heavy periods.   FOLLOW-UP INSTRUCTIONS When: 1 month For: fatigue, anemia  Please contact the clinic if you have any problems, or need to be seen sooner.

## 2017-10-02 ENCOUNTER — Other Ambulatory Visit: Payer: Self-pay | Admitting: Student in an Organized Health Care Education/Training Program

## 2017-10-02 DIAGNOSIS — M19042 Primary osteoarthritis, left hand: Principal | ICD-10-CM

## 2017-10-02 DIAGNOSIS — M19041 Primary osteoarthritis, right hand: Secondary | ICD-10-CM

## 2017-10-02 LAB — CBC WITH DIFFERENTIAL/PLATELET
Basophils Absolute: 0 10*3/uL (ref 0.0–0.2)
Basos: 0 %
EOS (ABSOLUTE): 0 10*3/uL (ref 0.0–0.4)
Eos: 0 %
Hematocrit: 34 % (ref 34.0–46.6)
Hemoglobin: 10.7 g/dL — ABNORMAL LOW (ref 11.1–15.9)
Immature Grans (Abs): 0 10*3/uL (ref 0.0–0.1)
Immature Granulocytes: 0 %
LYMPHS ABS: 3.1 10*3/uL (ref 0.7–3.1)
Lymphs: 35 %
MCH: 25.3 pg — ABNORMAL LOW (ref 26.6–33.0)
MCHC: 31.5 g/dL (ref 31.5–35.7)
MCV: 80 fL (ref 79–97)
Monocytes Absolute: 0.5 10*3/uL (ref 0.1–0.9)
Monocytes: 6 %
NEUTROS ABS: 5.3 10*3/uL (ref 1.4–7.0)
Neutrophils: 59 %
PLATELETS: 373 10*3/uL (ref 150–450)
RBC: 4.23 x10E6/uL (ref 3.77–5.28)
RDW: 17 % — ABNORMAL HIGH (ref 12.3–15.4)
WBC: 9 10*3/uL (ref 3.4–10.8)

## 2017-10-02 LAB — TESTOSTERONE: TESTOSTERONE: 39 ng/dL (ref 8–48)

## 2017-10-02 LAB — RETICULOCYTES: RETIC CT PCT: 1.4 % (ref 0.6–2.6)

## 2017-10-02 LAB — FOLATE: Folate: 6.6 ng/mL (ref >3.0)

## 2017-10-02 LAB — VITAMIN B12: Vitamin B-12: 646 pg/mL (ref 232–1245)

## 2017-10-04 ENCOUNTER — Encounter: Payer: Self-pay | Admitting: Internal Medicine

## 2017-10-04 NOTE — Progress Notes (Signed)
Internal Medicine Clinic Attending  I saw and evaluated the patient.  I personally confirmed the key portions of the history and exam documented by Dr. Vogel and I reviewed pertinent patient test results.  The assessment, diagnosis, and plan were formulated together and I agree with the documentation in the resident's note.  

## 2017-10-06 ENCOUNTER — Encounter: Payer: Self-pay | Admitting: *Deleted

## 2017-10-07 NOTE — Addendum Note (Signed)
Addended by: Maura CrandallGOLDSTON, Fread Kottke C on: 10/07/2017 11:46 AM   Modules accepted: Orders

## 2017-10-08 DIAGNOSIS — R2689 Other abnormalities of gait and mobility: Secondary | ICD-10-CM | POA: Diagnosis not present

## 2017-10-08 DIAGNOSIS — M25561 Pain in right knee: Secondary | ICD-10-CM | POA: Diagnosis not present

## 2017-10-08 DIAGNOSIS — M25511 Pain in right shoulder: Secondary | ICD-10-CM | POA: Diagnosis not present

## 2017-10-08 DIAGNOSIS — M25562 Pain in left knee: Secondary | ICD-10-CM | POA: Diagnosis not present

## 2017-10-08 DIAGNOSIS — M545 Low back pain: Secondary | ICD-10-CM | POA: Diagnosis not present

## 2017-10-08 DIAGNOSIS — M25512 Pain in left shoulder: Secondary | ICD-10-CM | POA: Diagnosis not present

## 2017-10-12 DIAGNOSIS — M25562 Pain in left knee: Secondary | ICD-10-CM | POA: Diagnosis not present

## 2017-10-12 DIAGNOSIS — R2689 Other abnormalities of gait and mobility: Secondary | ICD-10-CM | POA: Diagnosis not present

## 2017-10-12 DIAGNOSIS — M25512 Pain in left shoulder: Secondary | ICD-10-CM | POA: Diagnosis not present

## 2017-10-12 DIAGNOSIS — M545 Low back pain: Secondary | ICD-10-CM | POA: Diagnosis not present

## 2017-10-12 DIAGNOSIS — M25511 Pain in right shoulder: Secondary | ICD-10-CM | POA: Diagnosis not present

## 2017-10-12 DIAGNOSIS — M25561 Pain in right knee: Secondary | ICD-10-CM | POA: Diagnosis not present

## 2017-10-13 ENCOUNTER — Ambulatory Visit (HOSPITAL_COMMUNITY): Admission: RE | Admit: 2017-10-13 | Payer: Medicare Other | Source: Ambulatory Visit

## 2017-10-14 DIAGNOSIS — M25511 Pain in right shoulder: Secondary | ICD-10-CM | POA: Diagnosis not present

## 2017-10-14 DIAGNOSIS — M25512 Pain in left shoulder: Secondary | ICD-10-CM | POA: Diagnosis not present

## 2017-10-14 DIAGNOSIS — R2689 Other abnormalities of gait and mobility: Secondary | ICD-10-CM | POA: Diagnosis not present

## 2017-10-14 DIAGNOSIS — M25562 Pain in left knee: Secondary | ICD-10-CM | POA: Diagnosis not present

## 2017-10-14 DIAGNOSIS — M25561 Pain in right knee: Secondary | ICD-10-CM | POA: Diagnosis not present

## 2017-10-14 DIAGNOSIS — M545 Low back pain: Secondary | ICD-10-CM | POA: Diagnosis not present

## 2017-10-22 ENCOUNTER — Ambulatory Visit (HOSPITAL_COMMUNITY): Payer: Medicare Other

## 2017-10-27 ENCOUNTER — Ambulatory Visit (HOSPITAL_COMMUNITY): Payer: Medicare Other

## 2017-10-31 ENCOUNTER — Ambulatory Visit (HOSPITAL_BASED_OUTPATIENT_CLINIC_OR_DEPARTMENT_OTHER): Payer: Medicare Other | Attending: Student in an Organized Health Care Education/Training Program

## 2017-11-02 ENCOUNTER — Ambulatory Visit (HOSPITAL_COMMUNITY): Admission: RE | Admit: 2017-11-02 | Payer: Medicare Other | Source: Ambulatory Visit

## 2017-11-09 ENCOUNTER — Ambulatory Visit (HOSPITAL_COMMUNITY): Payer: Medicare Other

## 2017-11-09 ENCOUNTER — Encounter: Payer: Self-pay | Admitting: Internal Medicine

## 2017-11-11 ENCOUNTER — Ambulatory Visit: Payer: Medicare Other

## 2017-11-11 ENCOUNTER — Encounter: Payer: Medicare Other | Admitting: Internal Medicine

## 2017-11-16 ENCOUNTER — Encounter: Payer: Self-pay | Admitting: Internal Medicine

## 2017-11-17 DIAGNOSIS — M25512 Pain in left shoulder: Secondary | ICD-10-CM | POA: Diagnosis not present

## 2017-11-17 DIAGNOSIS — M25562 Pain in left knee: Secondary | ICD-10-CM | POA: Diagnosis not present

## 2017-11-17 DIAGNOSIS — M545 Low back pain: Secondary | ICD-10-CM | POA: Diagnosis not present

## 2017-11-17 DIAGNOSIS — M25511 Pain in right shoulder: Secondary | ICD-10-CM | POA: Diagnosis not present

## 2017-11-17 DIAGNOSIS — R2689 Other abnormalities of gait and mobility: Secondary | ICD-10-CM | POA: Diagnosis not present

## 2017-11-17 DIAGNOSIS — M25561 Pain in right knee: Secondary | ICD-10-CM | POA: Diagnosis not present

## 2017-11-18 ENCOUNTER — Ambulatory Visit (HOSPITAL_COMMUNITY): Admission: RE | Admit: 2017-11-18 | Payer: Medicare Other | Source: Ambulatory Visit

## 2017-11-25 ENCOUNTER — Ambulatory Visit (HOSPITAL_COMMUNITY): Payer: Medicare Other

## 2017-11-26 ENCOUNTER — Ambulatory Visit: Payer: Medicare Other

## 2017-11-30 ENCOUNTER — Other Ambulatory Visit: Payer: Self-pay

## 2017-11-30 ENCOUNTER — Ambulatory Visit (INDEPENDENT_AMBULATORY_CARE_PROVIDER_SITE_OTHER): Payer: Medicare Other | Admitting: Internal Medicine

## 2017-11-30 ENCOUNTER — Other Ambulatory Visit: Payer: Self-pay | Admitting: *Deleted

## 2017-11-30 VITALS — BP 141/86 | HR 51 | Temp 98.0°F | Ht 66.0 in | Wt 314.1 lb

## 2017-11-30 DIAGNOSIS — R5381 Other malaise: Secondary | ICD-10-CM | POA: Diagnosis not present

## 2017-11-30 DIAGNOSIS — Z6841 Body Mass Index (BMI) 40.0 and over, adult: Secondary | ICD-10-CM

## 2017-11-30 DIAGNOSIS — R030 Elevated blood-pressure reading, without diagnosis of hypertension: Secondary | ICD-10-CM

## 2017-11-30 DIAGNOSIS — D509 Iron deficiency anemia, unspecified: Secondary | ICD-10-CM | POA: Diagnosis not present

## 2017-11-30 DIAGNOSIS — R5383 Other fatigue: Secondary | ICD-10-CM

## 2017-11-30 DIAGNOSIS — D508 Other iron deficiency anemias: Secondary | ICD-10-CM

## 2017-11-30 DIAGNOSIS — R0683 Snoring: Secondary | ICD-10-CM | POA: Diagnosis not present

## 2017-11-30 MED ORDER — ALBUTEROL SULFATE HFA 108 (90 BASE) MCG/ACT IN AERS: INHALATION_SPRAY | RESPIRATORY_TRACT | 0 refills | Status: DC

## 2017-11-30 MED ORDER — FLUTICASONE-SALMETEROL 250-50 MCG/DOSE IN AEPB
INHALATION_SPRAY | RESPIRATORY_TRACT | 1 refills | Status: DC
Start: 2017-11-30 — End: 2018-04-13

## 2017-11-30 MED ORDER — MELATONIN 5 MG PO CAPS: 5.0000 mg | ORAL_CAPSULE | Freq: Every day | ORAL | 2 refills | Status: AC

## 2017-11-30 NOTE — Assessment & Plan Note (Signed)
She continued to have low ferritin stores and taking her iron pills with cereal and milk which might interfere with absorption. Since her previous clinic visits now she is taking it without any dairy products.  We will recheck ferritin today-patient left before the lab work, called the patient and she will stop by tomorrow for labs.

## 2017-11-30 NOTE — Progress Notes (Signed)
   CC: Fatigue and daytime sleepiness.  HPI:  [name] is a 47 y.o. with past medical history significant for iron deficiency anemia and morbid obesity came to the clinic with complaints of fatigue and daytime sleepiness.  Patient was seen in the clinic for similar complaints in July 2019, her lab work which include CBC, reticulocyte count, BNP, TSH, ferritin level, folate and vitamin B12 levels were all within normal limit, except low ferritin and patient was found to be taking her iron supplement with milk which was corrected.  According to patient she now takes her iron supplement without any milk or dairy product.  She was also scheduled for sleep study which is scheduled for December 06, 2017.  Patient continued to experience daytime fatigue and sleepiness.  She does not has a set bedtime routine, stating that she never slept a good night sleep since her high school time.  She goes to bed between 11-1, occasionally watches TV or do some other errands as she does not feel sleepiness at that time.  She normally gets up around 6 AM.  She snores at night. During the day she feels very tired and taking multiple naps lasting between 15minutes to 3 hours.  According to patient previous doctor gave her a letter that she cannot work because of her daytime sleepiness and she would like to renew that letter.  She has no other medical reason to stay out of work and need to work on her sleeping schedule.  Letter was provided with no restrictions to work, she was worried that she might get fired if she continued to sleep during work.  Normally sits in front of computer for her work and mostly do her work from home.  She denies any losing of muscle tone and falling during the day.  Please see assessment and plan for her chronic conditions.  Past Medical History:  Diagnosis Date  . Arthritis   . Asthma   . Morbid obesity with BMI of 45.0-49.9, adult (HCC) 05/15/2013   Review of Systems: Negative  except mentioned in HPI  Physical Exam:  Vitals:   11/30/17 0958  BP: (!) 141/86  Pulse: (!) 51  Temp: 98 F (36.7 C)  TempSrc: Oral  SpO2: 100%  Weight: (!) 314 lb 1.6 oz (142.5 kg)  Height: 5\' 6"  (1.676 m)    General: Vital signs reviewed.  Patient is well-developed and morbidly obese, appears tired, in no acute distress and cooperative with exam.  Head: Normocephalic and atraumatic. Eyes: EOMI, conjunctivae normal, no scleral icterus.  Cardiovascular: RRR, S1 normal, S2 normal, no murmurs, gallops, or rubs. Pulmonary/Chest: Clear to auscultation bilaterally, no wheezes, rales, or rhonchi. Abdominal: Soft, non-tender, non-distended, BS +,  Extremities: No lower extremity edema bilaterally,  pulses symmetric and intact bilaterally. No cyanosis or clubbing. Neurological: A&O x3, Strength is normal and symmetric bilaterally, cranial nerve II-XII are grossly intact, no focal motor deficit, sensory intact to light touch bilaterally.  Skin: Warm, dry and intact. No rashes or erythema. Psychiatric: Normal mood and affect. speech and behavior is normal. Cognition and memory are normal.  Assessment & Plan:   See Encounters Tab for problem based charting.  Patient discussed with Dr. Sandre Kittyaines.

## 2017-11-30 NOTE — Assessment & Plan Note (Signed)
Patient continued to experience daytime sleepiness and feeling fatigued. Her labs were within normal limit. Sleep study is pending-there is high probability of having sleep apnea because of her being morbidly obese and snoring. She does not has a good sleep routine. No evidence of cataplexy or narcolepsy.  Discussed a good sleep hygiene routine with patient. Gave her a prescription of melatonin 5 mg at bedtime. She has no other reason to stay away from work, so can return to work with no restrictions. She does need to work on her sleeping schedule. If you found to have a sleep apnea-using CPAP at night might help with her sleep and decreased her daytime sleepiness.

## 2017-11-30 NOTE — Patient Instructions (Addendum)
Thank you for visiting clinic today. As we discussed make a routine of your bedtime, you can try taking melatonin 5 mg around 8 PM, you can repeat it if needed, and see if that will help. Try minimizing your daytime naps. Avoid caffeinated drinks in the evening time. Please go for your sleep study to check for any sleep apnea, so it can be treated. We will check your iron stores today. Blood pressure remains little elevated, try following a DASH diet, information's are enclosed and exercise. Losing weight will help with your blood pressure. We will reevaluate you during next follow-up visit and might start you on a blood pressure medication if needed. Please follow-up in 1 month with your PCP.   Practice Good Sleep Hygiene Here are some suggestions Avoid napping during the day. It can disturb the normal pattern of sleep and wakefulness.  Avoid stimulants such as caffeine, nicotine, and alcohol too close to bedtime. While alcohol is well known to speed the onset of sleep, it disrupts sleep in the second half as the body begins to metabolize the alcohol, causing arousal.  Exercise can promote good sleep. Vigorous exercise should be taken in the morning or late afternoon. A relaxing exercise, like yoga, can be done before bed to help initiate a restful night's sleep. Food can be disruptive right before sleep. Stay away from large meals close to bedtime. Also dietary changes can cause sleep problems, if someone is struggling with a sleep problem, it's not a good time to start experimenting with spicy dishes. And, remember, chocolate has caffeine.  Ensure adequate exposure to natural light. This is particularly important for older people who may not venture outside as frequently as children and adults. Light exposure helps maintain a healthy sleep-wake cycle.  Establish a regular relaxing bedtime routine. Try to avoid emotionally upsetting conversations and activities before trying to go to sleep. Don't  dwell on, or bring your problems to bed.  Associate your bed with sleep. It's not a good idea to use your bed to watch TV, listen to the radio, or read.  Make sure that the sleep environment is pleasant and relaxing. The bed should be comfortable, the room should not be too hot or cold, or too bright.   DASH Eating Plan DASH stands for "Dietary Approaches to Stop Hypertension." The DASH eating plan is a healthy eating plan that has been shown to reduce high blood pressure (hypertension). It may also reduce your risk for type 2 diabetes, heart disease, and stroke. The DASH eating plan may also help with weight loss. What are tips for following this plan? General guidelines  Avoid eating more than 2,300 mg (milligrams) of salt (sodium) a day. If you have hypertension, you may need to reduce your sodium intake to 1,500 mg a day.  Limit alcohol intake to no more than 1 drink a day for nonpregnant women and 2 drinks a day for men. One drink equals 12 oz of beer, 5 oz of wine, or 1 oz of hard liquor.  Work with your health care provider to maintain a healthy body weight or to lose weight. Ask what an ideal weight is for you.  Get at least 30 minutes of exercise that causes your heart to beat faster (aerobic exercise) most days of the week. Activities may include walking, swimming, or biking.  Work with your health care provider or diet and nutrition specialist (dietitian) to adjust your eating plan to your individual calorie needs. Reading food labels  Check food labels for the amount of sodium per serving. Choose foods with less than 5 percent of the Daily Value of sodium. Generally, foods with less than 300 mg of sodium per serving fit into this eating plan.  To find whole grains, look for the word "whole" as the first word in the ingredient list. Shopping  Buy products labeled as "low-sodium" or "no salt added."  Buy fresh foods. Avoid canned foods and premade or frozen  meals. Cooking  Avoid adding salt when cooking. Use salt-free seasonings or herbs instead of table salt or sea salt. Check with your health care provider or pharmacist before using salt substitutes.  Do not fry foods. Cook foods using healthy methods such as baking, boiling, grilling, and broiling instead.  Cook with heart-healthy oils, such as olive, canola, soybean, or sunflower oil. Meal planning   Eat a balanced diet that includes: ? 5 or more servings of fruits and vegetables each day. At each meal, try to fill half of your plate with fruits and vegetables. ? Up to 6-8 servings of whole grains each day. ? Less than 6 oz of lean meat, poultry, or fish each day. A 3-oz serving of meat is about the same size as a deck of cards. One egg equals 1 oz. ? 2 servings of low-fat dairy each day. ? A serving of nuts, seeds, or beans 5 times each week. ? Heart-healthy fats. Healthy fats called Omega-3 fatty acids are found in foods such as flaxseeds and coldwater fish, like sardines, salmon, and mackerel.  Limit how much you eat of the following: ? Canned or prepackaged foods. ? Food that is high in trans fat, such as fried foods. ? Food that is high in saturated fat, such as fatty meat. ? Sweets, desserts, sugary drinks, and other foods with added sugar. ? Full-fat dairy products.  Do not salt foods before eating.  Try to eat at least 2 vegetarian meals each week.  Eat more home-cooked food and less restaurant, buffet, and fast food.  When eating at a restaurant, ask that your food be prepared with less salt or no salt, if possible. What foods are recommended? The items listed may not be a complete list. Talk with your dietitian about what dietary choices are best for you. Grains Whole-grain or whole-wheat bread. Whole-grain or whole-wheat pasta. Brown rice. Modena Morrow. Bulgur. Whole-grain and low-sodium cereals. Pita bread. Low-fat, low-sodium crackers. Whole-wheat flour  tortillas. Vegetables Fresh or frozen vegetables (raw, steamed, roasted, or grilled). Low-sodium or reduced-sodium tomato and vegetable juice. Low-sodium or reduced-sodium tomato sauce and tomato paste. Low-sodium or reduced-sodium canned vegetables. Fruits All fresh, dried, or frozen fruit. Canned fruit in natural juice (without added sugar). Meat and other protein foods Skinless chicken or [place]. Ground chicken or [place]. Pork with fat trimmed off. Fish and seafood. Egg whites. Dried beans, peas, or lentils. Unsalted nuts, nut butters, and seeds. Unsalted canned beans. Lean cuts of beef with fat trimmed off. Low-sodium, lean deli meat. Dairy Low-fat (1%) or fat-free (skim) milk. Fat-free, low-fat, or reduced-fat cheeses. Nonfat, low-sodium ricotta or cottage cheese. Low-fat or nonfat yogurt. Low-fat, low-sodium cheese. Fats and oils Soft margarine without trans fats. Vegetable oil. Low-fat, reduced-fat, or light mayonnaise and salad dressings (reduced-sodium). Canola, safflower, olive, soybean, and sunflower oils. Avocado. Seasoning and other foods Herbs. Spices. Seasoning mixes without salt. Unsalted popcorn and pretzels. Fat-free sweets. What foods are not recommended? The items listed may not be a complete list. Talk with your dietitian  about what dietary choices are best for you. Grains Baked goods made with fat, such as croissants, muffins, or some breads. Dry pasta or rice meal packs. Vegetables Creamed or fried vegetables. Vegetables in a cheese sauce. Regular canned vegetables (not low-sodium or reduced-sodium). Regular canned tomato sauce and paste (not low-sodium or reduced-sodium). Regular tomato and vegetable juice (not low-sodium or reduced-sodium). Rosita FirePickles. Olives. Fruits Canned fruit in a light or heavy syrup. Fried fruit. Fruit in cream or butter sauce. Meat and other protein foods Fatty cuts of meat. Ribs. Fried meat. Tomasa BlaseBacon. Sausage. Bologna and other processed lunch meats.  Salami. Fatback. Hotdogs. Bratwurst. Salted nuts and seeds. Canned beans with added salt. Canned or smoked fish. Whole eggs or egg yolks. Chicken or [place] with skin. Dairy Whole or 2% milk, cream, and half-and-half. Whole or full-fat cream cheese. Whole-fat or sweetened yogurt. Full-fat cheese. Nondairy creamers. Whipped toppings. Processed cheese and cheese spreads. Fats and oils Butter. Stick margarine. Lard. Shortening. Ghee. Bacon fat. Tropical oils, such as coconut, palm kernel, or palm oil. Seasoning and other foods Salted popcorn and pretzels. Onion salt, garlic salt, seasoned salt, table salt, and sea salt. Worcestershire sauce. Tartar sauce. Barbecue sauce. Teriyaki sauce. Soy sauce, including reduced-sodium. Steak sauce. Canned and packaged gravies. Fish sauce. Oyster sauce. Cocktail sauce. Horseradish that you find on the shelf. Ketchup. Mustard. Meat flavorings and tenderizers. Bouillon cubes. Hot sauce and Tabasco sauce. Premade or packaged marinades. Premade or packaged taco seasonings. Relishes. Regular salad dressings. Where to find more information:  National Heart, Lung, and Blood Institute: PopSteam.iswww.nhlbi.nih.gov  American Heart Association: www.heart.org Summary  The DASH eating plan is a healthy eating plan that has been shown to reduce high blood pressure (hypertension). It may also reduce your risk for type 2 diabetes, heart disease, and stroke.  With the DASH eating plan, you should limit salt (sodium) intake to 2,300 mg a day. If you have hypertension, you may need to reduce your sodium intake to 1,500 mg a day.  When on the DASH eating plan, aim to eat more fresh fruits and vegetables, whole grains, lean proteins, low-fat dairy, and heart-healthy fats.  Work with your health care provider or diet and nutrition specialist (dietitian) to adjust your eating plan to your individual calorie needs. This information is not intended to replace advice given to you by your health  care provider. Make sure you discuss any questions you have with your health care provider. Document Released: 02/19/2011 Document Revised: 02/24/2016 Document Reviewed: 02/24/2016 Elsevier Interactive Patient Education  [employer]2018 Elsevier Inc.

## 2017-11-30 NOTE — Assessment & Plan Note (Signed)
BP Readings from Last 3 Encounters:  11/30/17 (!) 141/86  10/01/17 136/77  09/06/17 (!) 149/94   Found to have elevated blood pressure. She had elevated blood pressure during her previous few clinic visits too. Patient does not follow low-sodium diet or exercise. Provided her with DASH diet information. She should try losing some weight as that will help with her high blood pressure. Will reevaluate during next follow-up visit, if remain elevated she will get benefit with an antihypertensive agent.

## 2017-12-01 NOTE — Progress Notes (Signed)
Internal Medicine Clinic Attending  Case discussed with Dr. Amin at the time of the visit.  We reviewed the resident's history and exam and pertinent patient test results.  I agree with the assessment, diagnosis, and plan of care documented in the resident's note.  Alexander Raines, M.D., Ph.D.  

## 2017-12-06 ENCOUNTER — Ambulatory Visit (HOSPITAL_BASED_OUTPATIENT_CLINIC_OR_DEPARTMENT_OTHER): Payer: Medicare Other | Attending: Student in an Organized Health Care Education/Training Program

## 2017-12-07 DIAGNOSIS — M25562 Pain in left knee: Secondary | ICD-10-CM | POA: Diagnosis not present

## 2017-12-07 DIAGNOSIS — M545 Low back pain: Secondary | ICD-10-CM | POA: Diagnosis not present

## 2017-12-07 DIAGNOSIS — M25512 Pain in left shoulder: Secondary | ICD-10-CM | POA: Diagnosis not present

## 2017-12-07 DIAGNOSIS — M25511 Pain in right shoulder: Secondary | ICD-10-CM | POA: Diagnosis not present

## 2017-12-07 DIAGNOSIS — M25561 Pain in right knee: Secondary | ICD-10-CM | POA: Diagnosis not present

## 2017-12-07 DIAGNOSIS — R2689 Other abnormalities of gait and mobility: Secondary | ICD-10-CM | POA: Diagnosis not present

## 2017-12-15 DIAGNOSIS — M25562 Pain in left knee: Secondary | ICD-10-CM | POA: Diagnosis not present

## 2017-12-15 DIAGNOSIS — R2689 Other abnormalities of gait and mobility: Secondary | ICD-10-CM | POA: Diagnosis not present

## 2017-12-15 DIAGNOSIS — M25512 Pain in left shoulder: Secondary | ICD-10-CM | POA: Diagnosis not present

## 2017-12-15 DIAGNOSIS — M545 Low back pain: Secondary | ICD-10-CM | POA: Diagnosis not present

## 2017-12-15 DIAGNOSIS — M25561 Pain in right knee: Secondary | ICD-10-CM | POA: Diagnosis not present

## 2017-12-15 DIAGNOSIS — M25511 Pain in right shoulder: Secondary | ICD-10-CM | POA: Diagnosis not present

## 2017-12-23 DIAGNOSIS — M25561 Pain in right knee: Secondary | ICD-10-CM | POA: Diagnosis not present

## 2017-12-23 DIAGNOSIS — M25512 Pain in left shoulder: Secondary | ICD-10-CM | POA: Diagnosis not present

## 2017-12-23 DIAGNOSIS — M25562 Pain in left knee: Secondary | ICD-10-CM | POA: Diagnosis not present

## 2017-12-23 DIAGNOSIS — M545 Low back pain: Secondary | ICD-10-CM | POA: Diagnosis not present

## 2017-12-23 DIAGNOSIS — M25511 Pain in right shoulder: Secondary | ICD-10-CM | POA: Diagnosis not present

## 2017-12-23 DIAGNOSIS — R2689 Other abnormalities of gait and mobility: Secondary | ICD-10-CM | POA: Diagnosis not present

## 2017-12-30 ENCOUNTER — Encounter: Payer: Self-pay | Admitting: Internal Medicine

## 2017-12-30 ENCOUNTER — Encounter: Payer: Medicare Other | Admitting: Internal Medicine

## 2017-12-30 NOTE — Progress Notes (Deleted)
   CC: fatigue  HPI:  [name] is a 46 y.o. female with GERD, AUB, obesity who presents for follow up of chronic fatigue and daytime sleepiness. She has chronic anemia, on iron supplementation and reports adherence to iron supplements. She has not been able to get her sleep study done yet. She was prescribed melatonin 5mg  at last visit and reports ***. She is aware of proper sleep hygiene and reports ***.   She denies loss of muscle tone or syncope.   Past Medical History:  Diagnosis Date  . Arthritis   . Asthma   . Morbid obesity with BMI of 45.0-49.9, adult (HCC) 05/15/2013     Physical Exam:  There were no vitals filed for this visit. ***  Assessment & Plan:   See Encounters Tab for problem based charting.  Patient {GC/GE:3044014::"discussed with","seen with"} Dr. {NAMES:[id number]::"Butcher","Granfortuna","E. Hoffman","Klima","Mullen","Narendra","Raines","Vincent"}

## 2018-01-03 ENCOUNTER — Other Ambulatory Visit: Payer: Self-pay | Admitting: Internal Medicine

## 2018-01-13 DIAGNOSIS — M25511 Pain in right shoulder: Secondary | ICD-10-CM | POA: Diagnosis not present

## 2018-01-13 DIAGNOSIS — M25512 Pain in left shoulder: Secondary | ICD-10-CM | POA: Diagnosis not present

## 2018-01-13 DIAGNOSIS — M25561 Pain in right knee: Secondary | ICD-10-CM | POA: Diagnosis not present

## 2018-01-13 DIAGNOSIS — M25562 Pain in left knee: Secondary | ICD-10-CM | POA: Diagnosis not present

## 2018-01-13 DIAGNOSIS — M545 Low back pain: Secondary | ICD-10-CM | POA: Diagnosis not present

## 2018-01-13 DIAGNOSIS — R2689 Other abnormalities of gait and mobility: Secondary | ICD-10-CM | POA: Diagnosis not present

## 2018-01-14 NOTE — Addendum Note (Signed)
Addended by: Remus Blake on: 01/14/2018 02:05 PM   Modules accepted: Orders

## 2018-01-17 DIAGNOSIS — M25562 Pain in left knee: Secondary | ICD-10-CM | POA: Diagnosis not present

## 2018-01-17 DIAGNOSIS — M545 Low back pain: Secondary | ICD-10-CM | POA: Diagnosis not present

## 2018-01-17 DIAGNOSIS — M25512 Pain in left shoulder: Secondary | ICD-10-CM | POA: Diagnosis not present

## 2018-01-17 DIAGNOSIS — R2689 Other abnormalities of gait and mobility: Secondary | ICD-10-CM | POA: Diagnosis not present

## 2018-01-17 DIAGNOSIS — M25511 Pain in right shoulder: Secondary | ICD-10-CM | POA: Diagnosis not present

## 2018-01-17 DIAGNOSIS — M25561 Pain in right knee: Secondary | ICD-10-CM | POA: Diagnosis not present

## 2018-01-20 ENCOUNTER — Encounter: Payer: Medicare Other | Admitting: Internal Medicine

## 2018-01-26 ENCOUNTER — Ambulatory Visit (INDEPENDENT_AMBULATORY_CARE_PROVIDER_SITE_OTHER): Payer: Medicare Other | Admitting: Internal Medicine

## 2018-01-26 ENCOUNTER — Other Ambulatory Visit: Payer: Self-pay

## 2018-01-26 DIAGNOSIS — J45909 Unspecified asthma, uncomplicated: Secondary | ICD-10-CM

## 2018-01-26 DIAGNOSIS — Z7951 Long term (current) use of inhaled steroids: Secondary | ICD-10-CM

## 2018-01-26 DIAGNOSIS — J069 Acute upper respiratory infection, unspecified: Secondary | ICD-10-CM | POA: Diagnosis not present

## 2018-01-26 MED ORDER — BENZONATATE 100 MG PO CAPS: 100.0000 mg | ORAL_CAPSULE | Freq: Four times a day (QID) | ORAL | 0 refills | Status: AC | PRN

## 2018-01-26 NOTE — Progress Notes (Signed)
   CC: cold like symptoms   HPI:  Cassidy [name] is a 47 y.o. with PMH as listed below who presents for cold like symptoms. Please see the assessment and plans for the status of the patient chronic medical problems.   Past Medical History:  Diagnosis Date  . Arthritis   . Asthma   . Morbid obesity with BMI of 45.0-49.9, adult (HCC) 05/15/2013   Review of Systems:  Refer to history of present illness and assessment and plans for pertinent review of systems, all others reviewed and negative  Physical Exam:  Vitals:   01/26/18 0941  BP: 120/66  Pulse: 68  Temp: 98.1 F (36.7 C)  TempSrc: Oral  SpO2: 100%  Weight: (!) 313 lb 11.2 oz (142.3 kg)  Height: 5\' 7"  (1.702 m)   General: no acute distress  HEENT: swollen tonsils without exudate, no cervical adenopathy, ears canals are non erythematous or tender, the tympanic membranes are intact without effusion  Pulm: normal work of breathing, lungs are clear to auscultation   Assessment & Plan:   Upper respiratory infection  Here for acute complaint of a cough. Symptoms started out as a cough last week, the cough has progressed in intensity and yellow green sputum production. The cough has made it difficult for her to breath at night.  The cough is associated with itchy wattery eyes, sore throat. She checked her temperature at home on Saturday and it was 100.1. She is involved in outpatient physical therapy and believes she may have picked it up there. She had some mild generalized body aches. She has no rhinorrhea. Today she has noticed significant improvement in all of her symptoms.  She does not have chronic allergies. She does have a history of asthma and takes advair on a daily basis and has not had an asthma exacerbation in years and has never needed to be intubated. She did not receive her influenza vaccine this year. She had difficulty breathing associated with these URI symptoms over the last few days which responded to the use of  rescue inhaler. She has been using warm tea, motrin, and a cold and flu syrup. Symptoms are consistent with resolving viral upper respiratory tract infection. Patient was encouraged to continue conservative management.   See Encounters Tab for problem based charting.  Patient discussed with Dr. Cyndie ChimeGranfortuna

## 2018-01-26 NOTE — Patient Instructions (Signed)
Thank you for coming to the clinic today. It was a pleasure to see you.   Please schedule an appointment to see Dr. Petra KubaVogel on your way out.   Please call the internal medicine center clinic if you have any questions or concerns, we may be able to help and keep you from a long and expensive emergency room wait. Our clinic and after hours phone number is [phone], the best time to call is Monday through Friday 9 am to 4 pm but there is always someone available 24/7 if you have an emergency. If you need medication refills please notify your pharmacy one week in advance and they will send us a request.   You have a viral infection of your sinuses and upper respiratory tract. Unfortunately, we do not have any good antibiotics to treat this infection. However, it should resolve on its own in about 10 days.  In the meantime, there are several good remedies that will treat your symptoms and hopefully help to make you more comfortable. Please look for one of the over-the-counter medications below that will help with your congestion, cough, and post-nasal drip. Additionally, it will be very important to use nasal irrigation and nasal steroid spray to clear you sinus congestion. I have provided a recipe and instructions below.     BUFFERED ISOTONIC SALINE NASAL IRRIGATION  The Benefits:  1. When you irrigate, the isotonic saline (salt water) acts as a solvent and washes the mucus crusts and other debris from your nose.  2. This decongests and improves the airflow into your nose. The sinus passages begin to open.   Over the counter:  I recommend a Squirt Bottle form of saline to provide an adequate volume of irrigation to clear your sinuses.   Home Recipe:  1. Choose a 1-quart glass jar that is thoroughly cleansed.  2. Fill with sterile or distilled water, or you can boil water from the tap.  3. Add 1 to 2 heaping teaspoons of "pickling/canning/sea" salt (NOT table salt as it contains  a large number of additives). This salt is available at the grocery store in the food canning section.  4. Mix ingredients together and store at room temperature. Discard after one week. If you find this solution too strong, you may decrease the amount of salt added to 1 to 1  teaspoons. With children it is often best to start with a milder solution and advance slowly. Irrigate with 240 ml (8 oz) twice daily.  The Instructions:  You should plan to irrigate your nose with buffered isotonic saline 2 times per day. Many people prefer to warm the solution slightly in the microwave - but be sure that the solution is NOT HOT. Stand over the sink (some do this in the shower) and squirt the solution into each side of your nose, keeping your mouth open. This allows you to spit the saltwater out of your mouth. It will not harm you if you swallow a little.  If you have been told to use a nasal steroid such as Flonase, Nasonex, or Nasacort, you should always use isortonic saline solution first, then use your nasal steroid product. The nasal steroid is much more effective when sprayed onto clean nasal membranes and the steroid medicine will reach deeper into the nose.  Most people experience a little burning sensation the first few times they use a isotonic saline solution, but this usually goes away within a few days.

## 2018-01-27 ENCOUNTER — Encounter: Payer: Self-pay | Admitting: Internal Medicine

## 2018-01-27 NOTE — Progress Notes (Signed)
Medicine attending: Medical history, presenting problems, physical findings, and medications, reviewed with resident physician Dr Nina Blum on the day of the patient visit and I concur with her evaluation and management plan. 

## 2018-01-27 NOTE — Assessment & Plan Note (Signed)
Here for acute complaint of a cough. Symptoms started out as a cough last week, the cough has progressed in intensity and yellow green sputum production. The cough has made it difficult for her to breath at night.  The cough is associated with itchy wattery eyes, sore throat. She checked her temperature at home on Saturday and it was 100.1. She is involved in outpatient physical therapy and believes she may have picked it up there. She had some mild generalized body aches. She has no rhinorrhea. Today she has noticed significant improvement in all of her symptoms.  She does not have chronic allergies. She does have a history of asthma and takes advair on a daily basis and has not had an asthma exacerbation in years and has never needed to be intubated. She did not receive her influenza vaccine this year. She had difficulty breathing associated with these URI symptoms over the last few days which responded to the use of rescue inhaler. She has been using warm tea, motrin, and a cold and flu syrup. Symptoms are consistent with resolving viral upper respiratory tract infection. Patient was encouraged to continue conservative management.

## 2018-02-15 ENCOUNTER — Other Ambulatory Visit: Payer: Self-pay | Admitting: Internal Medicine

## 2018-02-17 ENCOUNTER — Other Ambulatory Visit: Payer: Self-pay | Admitting: *Deleted

## 2018-02-17 DIAGNOSIS — M1990 Unspecified osteoarthritis, unspecified site: Secondary | ICD-10-CM

## 2018-02-18 MED ORDER — DICLOFENAC SODIUM 1 % TD GEL: TRANSDERMAL | 8 refills | Status: AC

## 2018-03-10 ENCOUNTER — Other Ambulatory Visit: Payer: Self-pay | Admitting: Internal Medicine

## 2018-03-22 ENCOUNTER — Other Ambulatory Visit: Payer: Self-pay | Admitting: *Deleted

## 2018-03-22 DIAGNOSIS — M19042 Primary osteoarthritis, left hand: Principal | ICD-10-CM

## 2018-03-22 DIAGNOSIS — M19041 Primary osteoarthritis, right hand: Secondary | ICD-10-CM

## 2018-03-23 MED ORDER — MELOXICAM 15 MG PO TABS: ORAL_TABLET | ORAL | 0 refills | Status: DC

## 2018-03-31 ENCOUNTER — Other Ambulatory Visit: Payer: Self-pay | Admitting: Internal Medicine

## 2018-03-31 DIAGNOSIS — R921 Mammographic calcification found on diagnostic imaging of breast: Secondary | ICD-10-CM

## 2018-04-11 ENCOUNTER — Ambulatory Visit
Admission: RE | Admit: 2018-04-11 | Discharge: 2018-04-11 | Disposition: A | Discharge status: Home / Self Care | Payer: Medicare Other | Source: Ambulatory Visit | Attending: Internal Medicine | Admitting: Internal Medicine

## 2018-04-11 DIAGNOSIS — R921 Mammographic calcification found on diagnostic imaging of breast: Secondary | ICD-10-CM | POA: Diagnosis not present

## 2018-04-13 ENCOUNTER — Other Ambulatory Visit: Payer: Self-pay | Admitting: Internal Medicine

## 2018-04-20 ENCOUNTER — Encounter: Payer: Self-pay | Admitting: Internal Medicine

## 2018-04-20 ENCOUNTER — Encounter: Payer: Medicare Other | Admitting: Internal Medicine

## 2018-04-22 ENCOUNTER — Other Ambulatory Visit: Payer: Self-pay | Admitting: Internal Medicine

## 2018-04-29 ENCOUNTER — Telehealth: Payer: Self-pay | Admitting: *Deleted

## 2018-04-29 NOTE — Telephone Encounter (Signed)
SPOKE WITH [facility] [place] / PATIENT NO SHOWES / SHE ALSO NO SHOWED  10/31/2017 AND 12/06/2017.  OFFICE TO CONTACT PATIENT FOR ANOTHER APPOINTMENT.

## 2018-05-02 ENCOUNTER — Telehealth: Payer: Self-pay | Admitting: *Deleted

## 2018-05-02 NOTE — Telephone Encounter (Signed)
CALLED PATIENT WAS UNABLE TO LEAVE MESSAGE / PHONE FULL. [facility] WILL TRY TO GET IN CONTACT WITH PATIENT AGAIN.. WILL SEND LETTER TO PATIENT.

## 2018-05-10 ENCOUNTER — Encounter: Payer: Self-pay | Admitting: Internal Medicine

## 2018-05-14 DIAGNOSIS — R05 Cough: Secondary | ICD-10-CM | POA: Diagnosis not present

## 2018-05-14 DIAGNOSIS — J111 Influenza due to unidentified influenza virus with other respiratory manifestations: Secondary | ICD-10-CM | POA: Diagnosis not present

## 2018-06-02 ENCOUNTER — Encounter: Payer: Medicare Other | Admitting: Internal Medicine

## 2018-06-04 ENCOUNTER — Other Ambulatory Visit: Payer: Self-pay | Admitting: Internal Medicine

## 2018-06-04 DIAGNOSIS — M19041 Primary osteoarthritis, right hand: Secondary | ICD-10-CM

## 2018-06-04 DIAGNOSIS — M19042 Primary osteoarthritis, left hand: Principal | ICD-10-CM

## 2018-06-20 ENCOUNTER — Other Ambulatory Visit: Payer: Self-pay | Admitting: Internal Medicine

## 2018-06-20 ENCOUNTER — Ambulatory Visit (INDEPENDENT_AMBULATORY_CARE_PROVIDER_SITE_OTHER): Payer: Medicare Other | Admitting: Internal Medicine

## 2018-06-20 ENCOUNTER — Other Ambulatory Visit: Payer: Self-pay

## 2018-06-20 ENCOUNTER — Encounter: Payer: Self-pay | Admitting: Internal Medicine

## 2018-06-20 DIAGNOSIS — J302 Other seasonal allergic rhinitis: Secondary | ICD-10-CM

## 2018-06-20 MED ORDER — CETIRIZINE HCL 10 MG PO TABS: 10.0000 mg | ORAL_TABLET | Freq: Every day | ORAL | 0 refills | Status: AC

## 2018-06-20 MED ORDER — FLUTICASONE PROPIONATE 50 MCG/ACT NA SUSP: 1.0000 | Freq: Every day | NASAL | 0 refills | Status: DC

## 2018-06-20 NOTE — Assessment & Plan Note (Signed)
   A: Allergic Rhinitis: Currently in [place], will be there for 2 weeks.  Feeling much better since calling in.  Originally had headache, congestion and post nasal drip consistent with her allergies. Positive for runny nose and seasonal allergies, no shortness of breath, negative for fatigue above her baseline(anemia), no thermometer at home but has not felt febrile no chills.  No sick contacts, has been only going to the grocery store and outside in her back yard, has been pulling some weeds.  No wheezing, no PND.  Two days ago coughing fit after long night of sinus drainage.   P: Most consistent with allergic rhinitis versus resolving cold will continuesupportive care will add flonase and zyrtec for patient   Pt given general precautions on covid avoidance and symptoms which would warrant further evaluation

## 2018-06-20 NOTE — Progress Notes (Signed)
   [facility] Internal Medicine Residency Telephone Encounter  Reason for call:   This telephone encounter was created for [name] on 06/20/2018 for the following purpose/cc Allergic Rhinitis.   Pertinent Data:   Hx of seasonal allergies, tension headaches, anemia (recently restarted oral iron) ROS: Pulmonary: pt denies increased work of breathing, shortness of breath,  Cardiac: pt denies palpitations, chest pain,   Abdominal: pt denies abdominal pain, nausea, vomiting, or diarrhea   Assessment / Plan / Recommendations:   A: Allergic Rhinitis: Currently in [place], will be there for 2 weeks.  Feeling much [name] since calling in.  Originally had headache, congestion and post nasal drip consistent with her allergies. Positive for runny nose and seasonal allergies, no shortness of breath, negative for fatigue above her baseline(anemia), no thermometer at home but has not felt febrile no chills.  No sick contacts, has been only going to the grocery store and outside in her back yard, has been pulling some weeds.  No wheezing, no PND.  Two days ago coughing fit after long night of sinus drainage.   P: Most consistent with allergic rhinitis versus resolving cold will continuesupportive care will add flonase and zyrtec for patient   Pt given general precautions on covid avoidance and symptoms which would warrant further evaluation  As always, pt is advised that if symptoms worsen or new symptoms arise, they should go to an urgent care facility or to to ER for further evaluation.   Consent and Medical Decision Making:   Patient discussed with Dr. Rogelia Boga  This is a telephone encounter between Cassidy [name] and Thornell Mule on 06/20/2018 for Allergic Rhinitis. The visit was conducted with the patient located at [place] at her mother's home and Thornell Mule at [facility]. The patient's identity was confirmed using their DOB and current address. The patient has consented  to being evaluated through a telephone encounter and understands the associated risks (an examination cannot be done and the patient may need to come in for an appointment) / benefits (allows the patient to remain at home, decreasing exposure to coronavirus). I personally spent 12 minutes on medical discussion.

## 2018-06-22 NOTE — Progress Notes (Signed)
Internal Medicine Clinic Attending  Case discussed with Dr. Winfrey  at the time of the visit.  We reviewed the resident's history and exam and pertinent patient test results.  I agree with the assessment, diagnosis, and plan of care documented in the resident's note.  

## 2018-07-07 ENCOUNTER — Telehealth: Payer: Self-pay | Admitting: *Deleted

## 2018-07-07 ENCOUNTER — Ambulatory Visit (INDEPENDENT_AMBULATORY_CARE_PROVIDER_SITE_OTHER): Payer: Medicare Other | Admitting: Internal Medicine

## 2018-07-07 ENCOUNTER — Other Ambulatory Visit: Payer: Self-pay

## 2018-07-07 DIAGNOSIS — Z79899 Other long term (current) drug therapy: Secondary | ICD-10-CM

## 2018-07-07 DIAGNOSIS — D649 Anemia, unspecified: Secondary | ICD-10-CM | POA: Diagnosis not present

## 2018-07-07 DIAGNOSIS — M1990 Unspecified osteoarthritis, unspecified site: Secondary | ICD-10-CM | POA: Diagnosis not present

## 2018-07-07 DIAGNOSIS — N939 Abnormal uterine and vaginal bleeding, unspecified: Secondary | ICD-10-CM

## 2018-07-07 DIAGNOSIS — N3941 Urge incontinence: Secondary | ICD-10-CM

## 2018-07-07 NOTE — Telephone Encounter (Signed)
Call made to patient, no answer-left message on pt's recorder. Will discuss with patient why she should go to [facility] to have labs obtained.Cassidy Spittle Cassady4/23/20203:46 PM  4

## 2018-07-07 NOTE — Assessment & Plan Note (Addendum)
Continues to have irregular, heavy, painful menses. Has seen OBGYN several years ago in [place]. I ordered a transvaginal US but she never got it done. She has been taking oral iron once daily but has not noticed an improvement in her energy levels. Her cbc was last checked 9 months ago  - CBC, iron studies to check response to iron supplementation. She can get these labs drawn when she comes into clinic for knee injection - refer to GYN

## 2018-07-07 NOTE — Assessment & Plan Note (Addendum)
Continues to have pain in both knee, low back, and both hands. Her knees bother her the most. She recently participated in PT which has helped and she has been taking meloxicam daily and using voltaren gel. These medications provide some relief but her pain is lasting longer than usual. She is interested in next steps of treating her arthritis. She last had x-rays of the knees and hands 5-6 years ago. She had mild degenerative changes in the knees at that time and the hands were unremarkable. She denies fevers, chills, or significantly red or swollen joints. She has never had a joint injection.   - schedule clinic visit with Dr. Cleda Daub for knee injection(s) - continue PT exercises at home - continue meloxicam and voltaren gel - will hold off on repeat x-rays. If she ends up getting referred to ortho for possible knee replacement we could get repeat x-rays at that time

## 2018-07-07 NOTE — Assessment & Plan Note (Signed)
Well controlled on vesicare. Will refill medication.

## 2018-07-07 NOTE — Progress Notes (Signed)
  [facility] Internal Medicine Residency Telephone Encounter Continuity Care Appointment  HPI:   This telephone encounter was created for [name] on 07/07/2018 for the following purpose/cc joint pain, anemia.   Past Medical History:  Past Medical History:  Diagnosis Date  . Arthritis   . Asthma   . Morbid obesity with BMI of 45.0-49.9, adult (HCC) 05/15/2013      ROS:   Denies fevers, chills, cough, sob   Assessment / Plan / Recommendations:   Please see A&P under problem oriented charting for assessment of the patient's acute and chronic medical conditions.   As always, pt is advised that if symptoms worsen or new symptoms arise, they should go to an urgent care facility or to to ER for further evaluation.   Consent and Medical Decision Making:   Patient discussed with Dr. Heide Spark  This is a telephone encounter between Cassidy [name] and Ali Lowe on 07/07/2018 for joint pain, anemia. The visit was conducted with the patient located at home and Ali Lowe at [facility]. The patient's identity was confirmed using their DOB and current address. The patient has consented to being evaluated through a telephone encounter and understands the associated risks (an examination cannot be done and the patient may need to come in for an appointment) / benefits (allows the patient to remain at home, decreasing exposure to coronavirus). I personally spent 25 minutes on medical discussion.

## 2018-07-07 NOTE — Telephone Encounter (Signed)
-----   Message from Remus Blake sent at 07/07/2018  3:38 PM EDT ----- Regarding: FW: [facility]  ----- Message ----- From: Ali Lowe, MD Sent: 07/07/2018   3:19 PM EDT To: Bufford Spikes, Remus Blake Subject: FW: [facility]                                   Nevermind! After further discussion with the patient, she will get her labs drawn in our clinic.   ----- Message ----- From: Ali Lowe, MD Sent: 07/07/2018   3:12 PM EDT To: Bufford Spikes, Vickie Nena Jordan Subject: [facility]                                       Just FYI, I am ordering a CBC and ferritin level to be done at [facility]. Please change the order accordingly. Also, will someone call the patient to schedule an appointment or does she just go by [facility] whenever?  - Dr. Petra Kuba

## 2018-07-08 NOTE — Progress Notes (Signed)
Internal Medicine Clinic Attending  Case discussed with Dr. Petra Kuba at the time of the visit.  We reviewed the resident's history and pertinent patient test results.  I agree with the assessment, diagnosis, and plan of care documented in the resident's note.

## 2018-07-08 NOTE — Telephone Encounter (Signed)
Attempted to contact pt-no answer, message left on recorder.Cassidy Spittle Cassady4/24/20202:34 PM

## 2018-08-01 ENCOUNTER — Telehealth: Payer: Self-pay | Admitting: *Deleted

## 2018-08-01 NOTE — Telephone Encounter (Signed)
Rx for generic vesicare sent 02/16/2018 was not received by pharmacy. Rx called today to [facility] at [facility] with only 2 refills. Kinnie Feil, RN, BSN

## 2018-08-02 ENCOUNTER — Telehealth: Payer: Self-pay | Admitting: *Deleted

## 2018-08-02 NOTE — Telephone Encounter (Signed)
Call to [facility] at [phone] for PA for Solifenacin( Vesicare).  Sent to CoverMyMeds to process.  Patient will need to try and fail Myrbetriq, Oxybutynin Chloride ER, Toviaz Tolterodine ER.  Message to be sent to Dr. Petra Kuba to consider a change to 1 of the preferred medications.  Angelina Ok, RN 08/02/2018 3:24 PM.

## 2018-08-04 ENCOUNTER — Other Ambulatory Visit: Payer: Self-pay | Admitting: Internal Medicine

## 2018-08-04 DIAGNOSIS — N3941 Urge incontinence: Secondary | ICD-10-CM

## 2018-08-04 MED ORDER — OXYBUTYNIN CHLORIDE ER 5 MG PO TB24: 5.0000 mg | ORAL_TABLET | Freq: Every day | ORAL | 0 refills | Status: AC

## 2018-08-04 NOTE — Telephone Encounter (Signed)
I just sent in an Rx to [facility] for oxybutynin chloride ER 5mg  daily. Can you please try to get in touch with the patient and inform her of this change. She can continue taking the remainder of her Vesicare and once she runs out of her current supply, she can start taking oxybutynin. Thanks!

## 2018-08-10 ENCOUNTER — Telehealth: Payer: Self-pay | Admitting: *Deleted

## 2018-08-10 ENCOUNTER — Telehealth: Payer: Self-pay | Admitting: Family Medicine

## 2018-08-10 NOTE — Telephone Encounter (Signed)
LVM FOR PATIENT TO RETURN CALL TO CLINIC. PATIENT NEEDS TO HAVE A MY CHART ACCOUNT / HER GYN APPOINTMENT HAS BEEN SET UP FOR JUINE 8. 2020 @ 3:35PM VIRTUAL VIDEO VISIT. / DR Alysia Penna.  [phone].  WAITING CALL BACK FROM PATIENT

## 2018-08-10 NOTE — Telephone Encounter (Signed)
Spoke with Lela from [facility], she will tell the patient to download the Mychart App for her new appt.

## 2018-08-11 ENCOUNTER — Encounter: Payer: Self-pay | Admitting: *Deleted

## 2018-08-22 ENCOUNTER — Telehealth: Payer: Self-pay

## 2018-08-22 ENCOUNTER — Telehealth: Payer: Medicare Other | Admitting: Obstetrics and Gynecology

## 2018-08-22 ENCOUNTER — Other Ambulatory Visit: Payer: Self-pay

## 2018-08-22 NOTE — Telephone Encounter (Signed)
Called the patient to inform of the virtual appointment and also the copay of 352.00. She stated she did not understand the what the appointment was for. She stated [facility] made her a lot of appointments however she does not have an issue with bleeding. She stated she has an issue with cramping. Informed the patient the visit can be completed based on the current complaint. The patient denied the appointment. Also stated she could pay a small amount toward the copay such as 20.00. She denied. She stated she would call [facility]. Informed the call attempts to the patient on our end as well. The patient stated she would like to cancel.

## 2018-08-22 NOTE — Addendum Note (Signed)
Addended by: Hulan Fray on: 08/22/2018 06:48 PM   Modules accepted: Orders

## 2018-08-24 ENCOUNTER — Other Ambulatory Visit: Payer: Self-pay | Admitting: Internal Medicine

## 2018-08-24 DIAGNOSIS — M1990 Unspecified osteoarthritis, unspecified site: Secondary | ICD-10-CM

## 2018-08-24 DIAGNOSIS — M19041 Primary osteoarthritis, right hand: Secondary | ICD-10-CM

## 2018-08-24 DIAGNOSIS — M19042 Primary osteoarthritis, left hand: Secondary | ICD-10-CM

## 2018-08-25 NOTE — Telephone Encounter (Signed)
RTC to patient, no answer and non-identifying VM obtained so RN unable to leave message regarding why Lidex medication was refused for refill per Dr. Donne Hazel. SChaplin, RN,BSN

## 2018-09-03 ENCOUNTER — Encounter: Payer: Self-pay | Admitting: *Deleted

## 2018-11-09 ENCOUNTER — Other Ambulatory Visit: Payer: Self-pay | Admitting: *Deleted

## 2018-11-10 ENCOUNTER — Telehealth: Payer: Self-pay | Admitting: *Deleted

## 2018-11-10 ENCOUNTER — Other Ambulatory Visit: Payer: Self-pay | Admitting: Internal Medicine

## 2018-11-10 MED ORDER — ALBUTEROL SULFATE HFA 108 (90 BASE) MCG/ACT IN AERS: INHALATION_SPRAY | RESPIRATORY_TRACT | 0 refills | Status: AC

## 2018-11-10 NOTE — Telephone Encounter (Signed)
Call to [facility] about PA for Vesicare.  Patient no longer on has been changed to Oxybutynin.  Sander Nephew, RN 11/10/2018 4:17 PM.

## 2019-01-27 DIAGNOSIS — M5410 Radiculopathy, site unspecified: Secondary | ICD-10-CM | POA: Diagnosis not present

## 2019-01-27 DIAGNOSIS — E669 Obesity, unspecified: Secondary | ICD-10-CM | POA: Diagnosis not present

## 2019-01-27 DIAGNOSIS — M797 Fibromyalgia: Secondary | ICD-10-CM | POA: Diagnosis not present

## 2019-01-31 ENCOUNTER — Other Ambulatory Visit: Payer: Self-pay | Admitting: *Deleted

## 2019-04-04 ENCOUNTER — Other Ambulatory Visit: Payer: Self-pay | Admitting: Internal Medicine

## 2019-04-06 ENCOUNTER — Telehealth: Payer: Self-pay

## 2019-04-06 NOTE — Telephone Encounter (Signed)
Received following message from [facility] regarding Wixela RX:  "Drug not covered by patient plan.  The preferred alternative is Symbicort AER, Advair Disk AER, Breo Ellipt.  Please change and send new RX".  Will forward to PCP. Thank you, Skip Estimable, RN,BSN

## 2019-04-13 MED ORDER — FLUTICASONE-SALMETEROL 250-50 MCG/DOSE IN AEPB: 1.0000 | INHALATION_SPRAY | Freq: Two times a day (BID) | RESPIRATORY_TRACT | 2 refills | Status: DC

## 2019-04-13 NOTE — Telephone Encounter (Signed)
Sent in new RX for Advair.  Dellia Cloud

## 2019-05-02 DIAGNOSIS — M797 Fibromyalgia: Secondary | ICD-10-CM | POA: Diagnosis not present

## 2019-05-02 DIAGNOSIS — J410 Simple chronic bronchitis: Secondary | ICD-10-CM | POA: Diagnosis not present

## 2019-05-02 DIAGNOSIS — J309 Allergic rhinitis, unspecified: Secondary | ICD-10-CM | POA: Diagnosis not present

## 2019-06-06 ENCOUNTER — Other Ambulatory Visit: Payer: Self-pay | Admitting: *Deleted

## 2019-06-12 MED ORDER — FLUTICASONE-SALMETEROL 250-50 MCG/DOSE IN AEPB: 1.0000 | INHALATION_SPRAY | Freq: Two times a day (BID) | RESPIRATORY_TRACT | 0 refills | Status: AC

## 2019-06-27 DIAGNOSIS — M545 Low back pain: Secondary | ICD-10-CM | POA: Diagnosis not present

## 2019-06-27 DIAGNOSIS — M609 Myositis, unspecified: Secondary | ICD-10-CM | POA: Diagnosis not present

## 2019-06-27 DIAGNOSIS — E669 Obesity, unspecified: Secondary | ICD-10-CM | POA: Diagnosis not present

## 2019-06-28 DIAGNOSIS — D649 Anemia, unspecified: Secondary | ICD-10-CM | POA: Diagnosis not present

## 2019-06-28 DIAGNOSIS — Z0000 Encounter for general adult medical examination without abnormal findings: Secondary | ICD-10-CM | POA: Diagnosis not present

## 2019-06-28 DIAGNOSIS — E119 Type 2 diabetes mellitus without complications: Secondary | ICD-10-CM | POA: Diagnosis not present

## 2019-06-28 DIAGNOSIS — E782 Mixed hyperlipidemia: Secondary | ICD-10-CM | POA: Diagnosis not present

## 2019-06-28 DIAGNOSIS — E039 Hypothyroidism, unspecified: Secondary | ICD-10-CM | POA: Diagnosis not present

## 2019-07-03 ENCOUNTER — Other Ambulatory Visit: Payer: Self-pay | Admitting: *Deleted

## 2019-07-07 DIAGNOSIS — J309 Allergic rhinitis, unspecified: Secondary | ICD-10-CM | POA: Diagnosis not present

## 2019-07-07 DIAGNOSIS — Z03818 Encounter for observation for suspected exposure to other biological agents ruled out: Secondary | ICD-10-CM | POA: Diagnosis not present

## 2019-07-12 DIAGNOSIS — R109 Unspecified abdominal pain: Secondary | ICD-10-CM | POA: Diagnosis not present

## 2019-07-12 DIAGNOSIS — N390 Urinary tract infection, site not specified: Secondary | ICD-10-CM | POA: Diagnosis not present

## 2019-07-12 DIAGNOSIS — R102 Pelvic and perineal pain: Secondary | ICD-10-CM | POA: Diagnosis not present

## 2019-07-12 DIAGNOSIS — J45909 Unspecified asthma, uncomplicated: Secondary | ICD-10-CM | POA: Diagnosis not present

## 2019-07-12 DIAGNOSIS — E669 Obesity, unspecified: Secondary | ICD-10-CM | POA: Diagnosis not present

## 2019-07-13 DIAGNOSIS — E669 Obesity, unspecified: Secondary | ICD-10-CM | POA: Diagnosis not present

## 2019-07-13 DIAGNOSIS — N390 Urinary tract infection, site not specified: Secondary | ICD-10-CM | POA: Diagnosis not present

## 2019-07-13 DIAGNOSIS — M47816 Spondylosis without myelopathy or radiculopathy, lumbar region: Secondary | ICD-10-CM | POA: Diagnosis not present

## 2019-07-13 DIAGNOSIS — M545 Low back pain: Secondary | ICD-10-CM | POA: Diagnosis not present

## 2019-10-13 DIAGNOSIS — E669 Obesity, unspecified: Secondary | ICD-10-CM | POA: Diagnosis not present

## 2019-10-13 DIAGNOSIS — N3941 Urge incontinence: Secondary | ICD-10-CM | POA: Diagnosis not present

## 2019-10-13 DIAGNOSIS — E782 Mixed hyperlipidemia: Secondary | ICD-10-CM | POA: Diagnosis not present

## 2019-12-30 DIAGNOSIS — Z23 Encounter for immunization: Secondary | ICD-10-CM | POA: Diagnosis not present

## 2020-09-17 ENCOUNTER — Encounter: Payer: Self-pay | Admitting: *Deleted
